# Patient Record
Sex: Female | Born: 1955 | Race: Black or African American | Hispanic: No | Smoking: Current every day smoker
Health system: Southern US, Community
[De-identification: ages and names within clinical notes are randomized; demographics above are authoritative.]

## PROBLEM LIST (undated history)

## (undated) DIAGNOSIS — K219 Gastro-esophageal reflux disease without esophagitis: Secondary | ICD-10-CM

## (undated) DIAGNOSIS — Z9289 Personal history of other medical treatment: Secondary | ICD-10-CM

## (undated) DIAGNOSIS — I5030 Unspecified diastolic (congestive) heart failure: Secondary | ICD-10-CM

## (undated) DIAGNOSIS — I38 Endocarditis, valve unspecified: Secondary | ICD-10-CM

## (undated) DIAGNOSIS — I10 Essential (primary) hypertension: Secondary | ICD-10-CM

## (undated) DIAGNOSIS — I509 Heart failure, unspecified: Secondary | ICD-10-CM

## (undated) DIAGNOSIS — I351 Nonrheumatic aortic (valve) insufficiency: Secondary | ICD-10-CM

## (undated) HISTORY — DX: Personal history of other medical treatment: Z92.89

## (undated) HISTORY — DX: Endocarditis, valve unspecified: I38

## (undated) HISTORY — DX: Unspecified diastolic (congestive) heart failure: I50.30

## (undated) HISTORY — DX: Nonrheumatic aortic (valve) insufficiency: I35.1

## (undated) HISTORY — DX: Heart failure, unspecified: I50.9

## (undated) HISTORY — PX: NO PAST SURGERIES: SHX2092

---

## 2004-06-12 ENCOUNTER — Emergency Department: Payer: Self-pay | Admitting: Emergency Medicine

## 2004-10-12 ENCOUNTER — Emergency Department: Payer: Self-pay | Admitting: Emergency Medicine

## 2007-04-16 ENCOUNTER — Emergency Department: Payer: Self-pay | Admitting: Emergency Medicine

## 2007-08-13 ENCOUNTER — Emergency Department: Payer: Self-pay | Admitting: Emergency Medicine

## 2008-02-04 ENCOUNTER — Ambulatory Visit: Payer: Self-pay | Admitting: General Practice

## 2008-06-04 ENCOUNTER — Ambulatory Visit: Payer: Self-pay | Admitting: Family Medicine

## 2009-06-07 ENCOUNTER — Ambulatory Visit: Payer: Self-pay | Admitting: Family Medicine

## 2010-06-19 ENCOUNTER — Ambulatory Visit: Payer: Self-pay | Admitting: Family Medicine

## 2010-07-11 ENCOUNTER — Ambulatory Visit: Payer: Self-pay | Admitting: General Practice

## 2010-07-14 ENCOUNTER — Ambulatory Visit: Payer: Self-pay | Admitting: Family Medicine

## 2010-09-09 ENCOUNTER — Emergency Department: Payer: Self-pay | Admitting: Internal Medicine

## 2011-07-05 ENCOUNTER — Ambulatory Visit: Payer: Self-pay | Admitting: Family Medicine

## 2011-08-08 ENCOUNTER — Ambulatory Visit: Payer: Self-pay | Admitting: General Surgery

## 2011-08-15 LAB — PATHOLOGY REPORT

## 2012-07-08 ENCOUNTER — Ambulatory Visit: Payer: Self-pay | Admitting: Family Medicine

## 2013-05-26 ENCOUNTER — Emergency Department: Payer: Self-pay | Admitting: Emergency Medicine

## 2013-05-26 LAB — CBC WITH DIFFERENTIAL/PLATELET
BASOS PCT: 0.6 %
Basophil #: 0 10*3/uL (ref 0.0–0.1)
EOS ABS: 0.3 10*3/uL (ref 0.0–0.7)
EOS PCT: 3.5 %
HCT: 44.5 % (ref 35.0–47.0)
HGB: 14.1 g/dL (ref 12.0–16.0)
LYMPHS PCT: 23.3 %
Lymphocyte #: 1.9 10*3/uL (ref 1.0–3.6)
MCH: 28.5 pg (ref 26.0–34.0)
MCHC: 31.7 g/dL — ABNORMAL LOW (ref 32.0–36.0)
MCV: 90 fL (ref 80–100)
Monocyte #: 0.8 x10 3/mm (ref 0.2–0.9)
Monocyte %: 9.8 %
Neutrophil #: 5 10*3/uL (ref 1.4–6.5)
Neutrophil %: 62.8 %
Platelet: 263 10*3/uL (ref 150–440)
RBC: 4.95 10*6/uL (ref 3.80–5.20)
RDW: 15.5 % — AB (ref 11.5–14.5)
WBC: 8 10*3/uL (ref 3.6–11.0)

## 2013-05-26 LAB — COMPREHENSIVE METABOLIC PANEL
AST: 20 U/L (ref 15–37)
Albumin: 3.3 g/dL — ABNORMAL LOW (ref 3.4–5.0)
Alkaline Phosphatase: 103 U/L
Anion Gap: 2 — ABNORMAL LOW (ref 7–16)
BUN: 10 mg/dL (ref 7–18)
Bilirubin,Total: 0.4 mg/dL (ref 0.2–1.0)
CALCIUM: 9.2 mg/dL (ref 8.5–10.1)
Chloride: 106 mmol/L (ref 98–107)
Co2: 28 mmol/L (ref 21–32)
Creatinine: 0.97 mg/dL (ref 0.60–1.30)
EGFR (Non-African Amer.): >60
Glucose: 76 mg/dL (ref 65–99)
OSMOLALITY: 270 (ref 275–301)
POTASSIUM: 3.6 mmol/L (ref 3.5–5.1)
SGPT (ALT): 19 U/L (ref 12–78)
Sodium: 136 mmol/L (ref 136–145)
TOTAL PROTEIN: 8.4 g/dL — AB (ref 6.4–8.2)

## 2013-05-26 LAB — URINALYSIS, COMPLETE
BLOOD: NEGATIVE
Bilirubin,UR: NEGATIVE
GLUCOSE, UR: NEGATIVE mg/dL (ref 0–75)
KETONE: NEGATIVE
NITRITE: NEGATIVE
PH: 6 (ref 4.5–8.0)
PROTEIN: NEGATIVE
RBC,UR: 1 /HPF (ref 0–5)
SPECIFIC GRAVITY: 1.005 (ref 1.003–1.030)
WBC UR: 1 /HPF (ref 0–5)

## 2013-05-26 LAB — LIPASE, BLOOD: LIPASE: 1425 U/L — AB (ref 73–393)

## 2013-05-26 LAB — TROPONIN I

## 2013-07-08 ENCOUNTER — Ambulatory Visit: Payer: Self-pay | Admitting: Family Medicine

## 2013-08-15 ENCOUNTER — Emergency Department: Payer: Self-pay | Admitting: Emergency Medicine

## 2013-09-07 ENCOUNTER — Emergency Department: Payer: Self-pay | Admitting: Emergency Medicine

## 2013-09-07 LAB — COMPREHENSIVE METABOLIC PANEL
ALK PHOS: 88 U/L
ALT: 14 U/L (ref 12–78)
ANION GAP: 6 — AB (ref 7–16)
Albumin: 3 g/dL — ABNORMAL LOW (ref 3.4–5.0)
BUN: 16 mg/dL (ref 7–18)
Bilirubin,Total: 0.3 mg/dL (ref 0.2–1.0)
CALCIUM: 8.7 mg/dL (ref 8.5–10.1)
CREATININE: 1 mg/dL (ref 0.60–1.30)
Chloride: 107 mmol/L (ref 98–107)
Co2: 23 mmol/L (ref 21–32)
Glucose: 137 mg/dL — ABNORMAL HIGH (ref 65–99)
OSMOLALITY: 275 (ref 275–301)
POTASSIUM: 3.2 mmol/L — AB (ref 3.5–5.1)
SGOT(AST): 24 U/L (ref 15–37)
SODIUM: 136 mmol/L (ref 136–145)
TOTAL PROTEIN: 7.7 g/dL (ref 6.4–8.2)

## 2013-09-07 LAB — CBC
HCT: 41 % (ref 35.0–47.0)
HGB: 13.5 g/dL (ref 12.0–16.0)
MCH: 30.3 pg (ref 26.0–34.0)
MCHC: 32.9 g/dL (ref 32.0–36.0)
MCV: 92 fL (ref 80–100)
PLATELETS: 214 10*3/uL (ref 150–440)
RBC: 4.45 10*6/uL (ref 3.80–5.20)
RDW: 15.7 % — ABNORMAL HIGH (ref 11.5–14.5)
WBC: 8.6 10*3/uL (ref 3.6–11.0)

## 2013-09-07 LAB — LIPASE, BLOOD: LIPASE: 81 U/L (ref 73–393)

## 2014-07-14 ENCOUNTER — Ambulatory Visit
Admit: 2014-07-14 | Disposition: A | Discharge status: Home / Self Care | Payer: Self-pay | Attending: Internal Medicine | Admitting: Internal Medicine

## 2015-11-05 ENCOUNTER — Emergency Department: Payer: BLUE CROSS/BLUE SHIELD

## 2015-11-05 ENCOUNTER — Encounter: Payer: Self-pay | Admitting: Emergency Medicine

## 2015-11-05 ENCOUNTER — Emergency Department
Admission: EM | Admit: 2015-11-05 | Discharge: 2015-11-05 | Disposition: A | Discharge status: Home / Self Care | Payer: BLUE CROSS/BLUE SHIELD | Attending: Emergency Medicine | Admitting: Emergency Medicine

## 2015-11-05 DIAGNOSIS — R0602 Shortness of breath: Secondary | ICD-10-CM | POA: Diagnosis present

## 2015-11-05 DIAGNOSIS — J810 Acute pulmonary edema: Secondary | ICD-10-CM | POA: Insufficient documentation

## 2015-11-05 DIAGNOSIS — F17210 Nicotine dependence, cigarettes, uncomplicated: Secondary | ICD-10-CM | POA: Insufficient documentation

## 2015-11-05 HISTORY — DX: Gastro-esophageal reflux disease without esophagitis: K21.9

## 2015-11-05 LAB — CBC WITH DIFFERENTIAL/PLATELET
Basophils Absolute: 0.1 10*3/uL (ref 0–0.1)
Basophils Relative: 1 %
Eosinophils Absolute: 0.4 10*3/uL (ref 0–0.7)
Eosinophils Relative: 6 %
HEMATOCRIT: 37.5 % (ref 35.0–47.0)
Hemoglobin: 12.8 g/dL (ref 12.0–16.0)
LYMPHS ABS: 1.6 10*3/uL (ref 1.0–3.6)
MCH: 30.2 pg (ref 26.0–34.0)
MCHC: 34.2 g/dL (ref 32.0–36.0)
MCV: 88.4 fL (ref 80.0–100.0)
MONO ABS: 0.6 10*3/uL (ref 0.2–0.9)
Monocytes Relative: 8 %
Neutro Abs: 4.7 10*3/uL (ref 1.4–6.5)
Neutrophils Relative %: 64 %
Platelets: 183 10*3/uL (ref 150–440)
RBC: 4.24 MIL/uL (ref 3.80–5.20)
RDW: 15 % — ABNORMAL HIGH (ref 11.5–14.5)
WBC: 7.3 10*3/uL (ref 3.6–11.0)

## 2015-11-05 LAB — COMPREHENSIVE METABOLIC PANEL
ALK PHOS: 81 U/L (ref 38–126)
ALT: 12 U/L — ABNORMAL LOW (ref 14–54)
AST: 16 U/L (ref 15–41)
Albumin: 3.6 g/dL (ref 3.5–5.0)
Anion gap: 6 (ref 5–15)
BILIRUBIN TOTAL: 0.7 mg/dL (ref 0.3–1.2)
BUN: 10 mg/dL (ref 6–20)
CALCIUM: 8.7 mg/dL — AB (ref 8.9–10.3)
CO2: 23 mmol/L (ref 22–32)
Chloride: 111 mmol/L (ref 101–111)
Creatinine, Ser: 0.86 mg/dL (ref 0.44–1.00)
GFR calc Af Amer: >60 mL/min (ref >60)
GFR calc non Af Amer: >60 mL/min (ref >60)
Glucose, Bld: 99 mg/dL (ref 65–99)
Potassium: 3.6 mmol/L (ref 3.5–5.1)
Sodium: 140 mmol/L (ref 135–145)
TOTAL PROTEIN: 7.5 g/dL (ref 6.5–8.1)

## 2015-11-05 LAB — TROPONIN I: Troponin I: <0.03 ng/mL (ref <0.03)

## 2015-11-05 LAB — BRAIN NATRIURETIC PEPTIDE: B NATRIURETIC PEPTIDE 5: 91 pg/mL (ref 0.0–100.0)

## 2015-11-05 MED ORDER — FUROSEMIDE 20 MG PO TABS: 20.0000 mg | ORAL_TABLET | Freq: Every day | ORAL | 0 refills | Status: DC

## 2015-11-05 MED ORDER — ALBUTEROL SULFATE (2.5 MG/3ML) 0.083% IN NEBU: 5.0000 mg | INHALATION_SOLUTION | Freq: Once | RESPIRATORY_TRACT | Status: DC

## 2015-11-05 MED ORDER — FUROSEMIDE 40 MG PO TABS
20.0000 mg | ORAL_TABLET | Freq: Once | ORAL | Status: AC
  Administered 2015-11-05: 20 mg via ORAL
  Filled 2015-11-05: qty 1

## 2015-11-05 NOTE — ED Provider Notes (Signed)
[facility] Emergency Department Provider Note  Time seen: 2:27 PM  I have reviewed the triage vital signs and the nursing notes.   HISTORY  Chief Complaint Respiratory Distress    HPI Sarah Mack is a 60 y.o. female with a past medical history of gastric reflux who presents the emergency department with 1 week of cough and intermittent shortness of breath. According to the patient for the past one week she has been coughing with white/clear sputum production. Denies any fever, chest pain, leg swelling or leg pain. Patient states for the past 2 days she has been feeling short of breath intermittently. States she is able to lie down when going to bed without issue. Denies any recent chest pain, tightness or pressure. Denies any history of cardiac disease, CHF or COPD.Describes her shortness of breath as mild, moderate occasionally.  Past Medical History:  Diagnosis Date  . GERD (gastroesophageal reflux disease)     There are no active problems to display for this patient.   History reviewed. No pertinent surgical history.  Prior to Admission medications   Not on File    No Known Allergies  No family history on file.  Social History Social History  Substance Use Topics  . Smoking status: Current Every Day Smoker    Packs/day: 0.50    Types: Cigarettes  . Smokeless tobacco: Not on file  . Alcohol use Not on file    Review of Systems Constitutional: Negative for fever. Cardiovascular: Negative for chest pain. Respiratory: Positive for shortness of breath 1-2 days. Gastrointestinal: Negative for abdominal pain, vomiting and diarrhea. Genitourinary: Negative for dysuria. Musculoskeletal: Negative for back pain. Skin: Negative for rash. Neurological: Negative for headaches, focal weakness or numbness. 10-point ROS otherwise negative.  ____________________________________________   PHYSICAL EXAM:  VITAL SIGNS: ED Triage Vitals  Enc  Vitals Group     BP 11/05/15 1303 (!) 138/58     Pulse Rate 11/05/15 1303 88     Resp 11/05/15 1303 18     Temp 11/05/15 1303 97.7 F (36.5 C)     Temp Source 11/05/15 1303 Oral     SpO2 11/05/15 1303 95 %     Weight 11/05/15 1303 202 lb (91.6 kg)     Height 11/05/15 1303  (1.6 m)     Head Circumference --      Peak Flow --      Pain Score 11/05/15 1305 3     Pain Loc --      Pain Edu? --      Excl. in GC? --     Constitutional: Alert and oriented. Well appearing and in no distress. Eyes: Normal exam ENT   Head: Normocephalic and atraumatic.   Mouth/Throat: Mucous membranes are moist. Cardiovascular: Normal rate, regular rhythm. No murmur Respiratory: Normal respiratory effort without tachypnea nor retractions. Breath sounds are clear  Gastrointestinal: Soft and nontender. No distention.   Musculoskeletal: Nontender with normal range of motion in all extremities. No lower extremity tenderness or edema. Neurologic:  Normal speech and language. No gross focal neurologic deficits are appreciated. Skin:  Skin is warm, dry and intact.  Psychiatric: Mood and affect are normal. Speech and behavior are normal.   ____________________________________________    EKG  EKG reviewed and interpreted by myself shows normal sinus rhythm at 77 bpm, narrow QRS, normal axis, normal intervals, no concerning ST changes noted.  ____________________________________________    RADIOLOGY  Chest x-ray shows perihilar edema most consistent with  mild pulmonary edema.  ____________________________________________   INITIAL IMPRESSION / ASSESSMENT AND PLAN / ED COURSE  Pertinent labs & imaging results that were available during my care of the patient were reviewed by me and considered in my medical decision making (see chart for details).  Patient presents with 1 week of cough with white/clear sputum, 1-2 days of shortness of breath. Overall the patient appears well, denies any  shortness of breath at rest in the bed. Satting 94% on room air. Denies any recent chest pain. Patient's labs are largely within normal limits including negative troponin. EKG is unremarkable. Chest x-ray most consistent with mild pulmonary edema. I discussed this with the patient, she has no history of MI or CHF in the past. I discussed with Dr. Excell Seltzerooper of cardiology, who has reviewed the patient's labs and x-ray as well. We will start the patient on Lasix 40 mg daily for the next 3 days, and then 20 mg daily until she can be seen this week for her echocardiogram. I discussed this with the patient is agreeable to the plan, as well as strict return precautions for any increased shortness of breath or chest pain. We will discharge the patient home  ____________________________________________   FINAL CLINICAL IMPRESSION(S) / ED DIAGNOSES  Pulmonary edema Dyspnea    Minna AntisKevin Shiloh Southern, MD 11/05/15 1434

## 2015-11-05 NOTE — ED Notes (Signed)
Patient c/o shortness of breath and cough for the last 2 days. Patient states that she has had a chest cold this past week and has been coughing so much it has made her sore. Patient states that cough is productive with white sputum. Patient currently in NAD, respirations equal and unlabored.

## 2015-11-05 NOTE — ED Triage Notes (Signed)
States has had cough and SOB with exertion x 1 week. Denies fevers.

## 2015-11-07 ENCOUNTER — Other Ambulatory Visit: Payer: Self-pay

## 2015-11-07 DIAGNOSIS — I509 Heart failure, unspecified: Secondary | ICD-10-CM

## 2015-11-08 ENCOUNTER — Ambulatory Visit (INDEPENDENT_AMBULATORY_CARE_PROVIDER_SITE_OTHER): Payer: BLUE CROSS/BLUE SHIELD

## 2015-11-08 ENCOUNTER — Other Ambulatory Visit: Payer: Self-pay

## 2015-11-08 DIAGNOSIS — I509 Heart failure, unspecified: Secondary | ICD-10-CM

## 2015-11-17 ENCOUNTER — Ambulatory Visit: Payer: BLUE CROSS/BLUE SHIELD | Attending: Family | Admitting: Family

## 2015-11-17 ENCOUNTER — Encounter: Payer: Self-pay | Admitting: Family

## 2015-11-17 DIAGNOSIS — Z720 Tobacco use: Secondary | ICD-10-CM | POA: Diagnosis not present

## 2015-11-17 DIAGNOSIS — R000 Tachycardia, unspecified: Secondary | ICD-10-CM | POA: Insufficient documentation

## 2015-11-17 DIAGNOSIS — Z8249 Family history of ischemic heart disease and other diseases of the circulatory system: Secondary | ICD-10-CM | POA: Insufficient documentation

## 2015-11-17 DIAGNOSIS — I351 Nonrheumatic aortic (valve) insufficiency: Secondary | ICD-10-CM | POA: Insufficient documentation

## 2015-11-17 DIAGNOSIS — Z800 Family history of malignant neoplasm of digestive organs: Secondary | ICD-10-CM | POA: Insufficient documentation

## 2015-11-17 DIAGNOSIS — K219 Gastro-esophageal reflux disease without esophagitis: Secondary | ICD-10-CM | POA: Insufficient documentation

## 2015-11-17 DIAGNOSIS — F17210 Nicotine dependence, cigarettes, uncomplicated: Secondary | ICD-10-CM | POA: Diagnosis not present

## 2015-11-17 DIAGNOSIS — I5032 Chronic diastolic (congestive) heart failure: Secondary | ICD-10-CM | POA: Insufficient documentation

## 2015-11-17 DIAGNOSIS — Z833 Family history of diabetes mellitus: Secondary | ICD-10-CM | POA: Insufficient documentation

## 2015-11-17 MED ORDER — FUROSEMIDE 20 MG PO TABS: 20.0000 mg | ORAL_TABLET | Freq: Every day | ORAL | 3 refills | Status: DC

## 2015-11-17 NOTE — Progress Notes (Signed)
Subjective:    Patient ID: Sarah Mack, female    DOB: 28-Mar-1955, 60 y.o.   MRN: [id number]  Congestive Heart Failure  Presents for initial visit. The disease course has been improving. Associated symptoms include fatigue (mild). Pertinent negatives include no abdominal pain, chest pain, edema, orthopnea, palpitations or shortness of breath. The symptoms have been improving. Past treatments include ACE inhibitors and salt and fluid restriction. The treatment provided significant relief. Compliance with prior treatments has been good. There is no history of CVA, DM or HTN. She has one 1st degree relative with heart disease.  Nicotine Dependence  Presents for initial visit. Symptoms include fatigue (mild). Symptoms are negative for sore throat. Preferred tobacco types include cigarettes. Preferred cigarette types include filtered. Preferred strength is light. Her urge triggers include company of smokers, meal time and stress. She smokes < 1/2 a pack of cigarettes per day. Past treatments include nothing. Moon is thinking about quitting. There is no history of alcohol abuse.   Past Medical History:  Diagnosis Date  . CHF (congestive heart failure) (HCC)   . GERD (gastroesophageal reflux disease)     Past Surgical History:  Procedure Laterality Date  . NO PAST SURGERIES      Family History  Problem Relation Age of Onset  . Liver cancer Mother   . Heart failure Father   . Diabetes Father     Social History  Substance Use Topics  . Smoking status: Current Every Day Smoker    Packs/day: 0.50    Types: Cigarettes  . Smokeless tobacco: Never Used  . Alcohol use No    No Known Allergies  Prior to Admission medications   Medication Sig Start Date End Date Taking? Authorizing Provider  cetirizine (ZYRTEC) 10 MG tablet Take 10 mg by mouth daily as needed for allergies.   Yes Historical Provider, MD  furosemide (LASIX) 20 MG tablet Take 1 tablet (20 mg total) by mouth daily.  11/17/15  Yes Delma Freeze, FNP  ramipril (ALTACE) 5 MG capsule Take 5 mg by mouth daily.   Yes Historical Provider, MD      Review of Systems  Constitutional: Positive for fatigue (mild). Negative for appetite change.  HENT: Negative for congestion, postnasal drip and sore throat.   Eyes: Negative.   Respiratory: Positive for cough (more at night). Negative for chest tightness and shortness of breath.   Cardiovascular: Negative for chest pain, palpitations and leg swelling.  Gastrointestinal: Negative for abdominal distention and abdominal pain.  Endocrine: Negative.   Genitourinary: Negative.   Musculoskeletal: Negative for back pain and neck pain.  Skin: Negative.   Allergic/Immunologic: Negative.   Neurological: Negative for dizziness and light-headedness.  Hematological: Negative for adenopathy. Does not bruise/bleed easily.  Psychiatric/Behavioral: Positive for sleep disturbance (working different shifts). Negative for dysphoric mood. The patient is not nervous/anxious.        Objective:   Physical Exam  Constitutional: She is oriented to person, place, and time. She appears well-developed and well-nourished.  HENT:  Head: Normocephalic and atraumatic.  Eyes: Conjunctivae are normal. Pupils are equal, round, and reactive to light.  Neck: Normal range of motion. Neck supple.  Cardiovascular: Regular rhythm.  Tachycardia present.   Pulmonary/Chest: Effort normal. She has no wheezes. She has no rales.  Abdominal: Soft. She exhibits no distension. There is no tenderness.  Musculoskeletal: She exhibits no edema or tenderness.  Neurological: She is alert and oriented to person, place, and time.  Skin: Skin is  warm and dry.  Psychiatric: She has a normal mood and affect. Her behavior is normal. Thought content normal.  Nursing note and vitals reviewed.   BP (!) 103/55   Pulse 90   Resp 18   Ht 5' (1.524 m)   Wt 209 lb (94.8 kg)   SpO2 96%   BMI 40.82 kg/m         Assessment & Plan:  1: Chronic heart failure with preserved ejection fraction- Patient presents with a mild amount of fatigue with moderate exertion (Class II). She denies any shortness of breath or swelling in her legs/abdomen. She isn't weighing herself daily because she doesn't have any scales so a set of scales was given to her today. Instructed her to weigh herself first thing in the morning after using the bathroom and to call for an overnight weight gain of >2 pounds or a weekly weight gain of >5 pounds. She does add salt to her food. Discussed at length the importance of not adding any salt to her food and reviewed how to read food labels so that she can stay under 2000mg  sodium daily. Written dietary information was also given to her. She will need to get set up with a cardiologist. 2: Tobacco use- She says that she's smoking 1/2 ppd of cigarettes and is working on trying to quit. Complete cessation was discussed for 3 minutes with her.  3: Tachycardia- Heart rate in the 90's today. May need a beta blocker if her heart rate remains elevated.  Medication bottles were reviewed.  Patient mentions that she would like a female provider to become her PCP. Telephone numbers were given to 3 local practices that have female providers for patient to call and see if they are accepting new patients.   Return here in 1 month or sooner for any questions/problems before then.

## 2015-11-17 NOTE — Patient Instructions (Addendum)
Begin weighing daily and call for an overnight weight gain of > 2 pounds or a weekly weight gain of >5 pounds.   Call [facility] at [phone] to see if their female provider is taking new patients.  [facility] [place] [facility] at [phone]. Call [facility] at [phone]

## 2015-12-16 ENCOUNTER — Encounter: Payer: Self-pay | Admitting: Family

## 2015-12-16 ENCOUNTER — Ambulatory Visit: Payer: BLUE CROSS/BLUE SHIELD | Attending: Family | Admitting: Family

## 2015-12-16 VITALS — BP 92/49 | HR 84 | Resp 18 | Ht 59.0 in | Wt 202.0 lb

## 2015-12-16 DIAGNOSIS — I5032 Chronic diastolic (congestive) heart failure: Secondary | ICD-10-CM | POA: Insufficient documentation

## 2015-12-16 DIAGNOSIS — Z800 Family history of malignant neoplasm of digestive organs: Secondary | ICD-10-CM | POA: Insufficient documentation

## 2015-12-16 DIAGNOSIS — Z8249 Family history of ischemic heart disease and other diseases of the circulatory system: Secondary | ICD-10-CM | POA: Insufficient documentation

## 2015-12-16 DIAGNOSIS — K219 Gastro-esophageal reflux disease without esophagitis: Secondary | ICD-10-CM | POA: Diagnosis not present

## 2015-12-16 DIAGNOSIS — R000 Tachycardia, unspecified: Secondary | ICD-10-CM | POA: Insufficient documentation

## 2015-12-16 DIAGNOSIS — F17210 Nicotine dependence, cigarettes, uncomplicated: Secondary | ICD-10-CM | POA: Insufficient documentation

## 2015-12-16 DIAGNOSIS — Z720 Tobacco use: Secondary | ICD-10-CM

## 2015-12-16 NOTE — Progress Notes (Signed)
Patient ID: Sarah Mack, female    DOB: [birth year], 60 y.o.   MRN: [id number]  HPI  Ms. Sarah Mack is a 60 y/o female with a history of GERD, aortic insufficiency, chronic diastolic HF with ongoing tobacco use who returns for a follow-up appointment.   Last echo was done 11/08/15 with an EF of 55-60% without any aortic stenosis. Mild-mod aortic regurgitation  Last visit to the ED was 11/05/15 with acute pulmonary edema. Still hasn't gotten established with a PCP or cardiologist. [place] [employer].  Returns today for follow-up. Weight is down as she says that she's walking more and eating less. Drinking water and no more sodas. Denies any shortness of breath or swelling in her legs/abdomen. Only gets tired after working all day. Continues to smoke 1/2 ppd of cigarettes.   Past Medical History:  Diagnosis Date  . CHF (congestive heart failure) (HCC)   . GERD (gastroesophageal reflux disease)     Past Surgical History:  Procedure Laterality Date  . NO PAST SURGERIES      Family History  Problem Relation Age of Onset  . Liver cancer Mother   . Heart failure Father   . Diabetes Father     Social History  Substance Use Topics  . Smoking status: Current Every Day Smoker    Packs/day: 0.50    Types: Cigarettes  . Smokeless tobacco: Never Used  . Alcohol use No    No Known Allergies  Prior to Admission medications   Medication Sig Start Date End Date Taking? Authorizing Provider  cetirizine (ZYRTEC) 10 MG tablet Take 10 mg by mouth daily as needed for allergies.   Yes Historical Provider, MD  furosemide (LASIX) 20 MG tablet Take 1 tablet (20 mg total) by mouth daily. 11/17/15  Yes Delma Freezeina A Laruen Risser, FNP  ramipril (ALTACE) 5 MG capsule Take 5 mg by mouth daily.   Yes Historical Provider, MD    Review of Systems  Constitutional: Positive for fatigue (after working for 8 hours and standing the whole time). Negative for appetite change.  HENT: Negative for  congestion, postnasal drip and sore throat.   Eyes: Negative.   Respiratory: Negative for cough, chest tightness and shortness of breath.   Cardiovascular: Negative for chest pain, palpitations and leg swelling.  Gastrointestinal: Negative.   Endocrine: Negative.   Genitourinary: Negative.   Musculoskeletal: Negative for back pain and neck pain.  Skin: Negative.   Allergic/Immunologic: Negative.   Neurological: Negative for dizziness and light-headedness.  Hematological: Negative for adenopathy. Does not bruise/bleed easily.  Psychiatric/Behavioral: Negative for dysphoric mood and sleep disturbance. The patient is not nervous/anxious.    Vitals:   12/16/15 1330  BP: (!) 92/49  Pulse: 84  Resp: 18  SpO2: 96%  Weight: 202 lb (91.6 kg)  Height: 4\' 11"  (1.499 m)      Physical Exam  Constitutional: She is oriented to person, place, and time. She appears well-developed and well-nourished.  HENT:  Head: Normocephalic and atraumatic.  Eyes: Conjunctivae are normal. Pupils are equal, round, and reactive to light.  Neck: Normal range of motion. Neck supple.  Cardiovascular: Normal rate and regular rhythm.   Pulmonary/Chest: Effort normal. She has no wheezes. She has no rales.  Abdominal: Soft. She exhibits no distension. There is no tenderness.  Musculoskeletal: She exhibits no edema or tenderness.  Neurological: She is alert and oriented to person, place, and time.  Skin: Skin is warm and dry.  Psychiatric: She  has a normal mood and affect. Her behavior is normal. Thought content normal.  Vitals reviewed.    Assessment & Plan:  1: Chronic heart failure with preserved ejection fraction- -Echo done 11/08/15 with an EF of 55-60%.  -NYHA Class II -Volume status stable -Congratulated on weight loss. Down 7.6 pounds since she was last here on 11/17/15. Continue exercise and call for an overnight weight gain of >2 pounds or a weekly weight gain of >5 pounds.  -Needs cardiologist  appointment. Offered to call while in the office but she says that she'll call herself.   2: Tachycardia- -Resolved  3: Tobacco use- -Continues to smoke 1/2 ppd of cigarettes. Complete cessation discussed for 3 minutes with her.  -Needs PCP appointment. Again, patient says that she will call herself. 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   Return in 3 months or sooner for any questions/problems before then.

## 2015-12-16 NOTE — Patient Instructions (Signed)
Continue weighing daily and call for an overnight weight gain of > 2 pounds or a weekly weight gain of >5 pounds.    Smoking Cessation Quitting smoking is important to your health and has many advantages. However, it is not always easy to quit since nicotine is a very addictive drug. Oftentimes, people try 3 times or more before being able to quit. This document explains the best ways for you to prepare to quit smoking. Quitting takes hard work and a lot of effort, but you can do it. ADVANTAGES OF QUITTING SMOKING  You will live longer, feel better, and live better.  Your body will feel the impact of quitting smoking almost immediately.  Within 20 minutes, blood pressure decreases. Your pulse returns to its normal level.  After 8 hours, carbon monoxide levels in the blood return to normal. Your oxygen level increases.  After 24 hours, the chance of having a heart attack starts to decrease. Your breath, hair, and body stop smelling like smoke.  After 48 hours, damaged nerve endings begin to recover. Your sense of taste and smell improve.  After 72 hours, the body is virtually free of nicotine. Your bronchial tubes relax and breathing becomes easier.  After 2 to 12 weeks, lungs can hold more air. Exercise becomes easier and circulation improves.  The risk of having a heart attack, stroke, cancer, or lung disease is greatly reduced.  After 1 year, the risk of coronary heart disease is cut in half.  After 5 years, the risk of stroke falls to the same as a nonsmoker.  After 10 years, the risk of lung cancer is cut in half and the risk of other cancers decreases significantly.  After 15 years, the risk of coronary heart disease drops, usually to the level of a nonsmoker.  If you are pregnant, quitting smoking will improve your chances of having a healthy baby.  The people you live with, especially any children, will be healthier.  You will have extra money to spend on things other  than cigarettes. QUESTIONS TO THINK ABOUT BEFORE ATTEMPTING TO QUIT You may want to talk about your answers with your health care provider.  Why do you want to quit?  If you tried to quit in the past, what helped and what did not?  What will be the most difficult situations for you after you quit? How will you plan to handle them?  Who can help you through the tough times? Your family? Friends? A health care provider?  What pleasures do you get from smoking? What ways can you still get pleasure if you quit? Here are some questions to ask your health care provider:  How can you help me to be successful at quitting?  What medicine do you think would be best for me and how should I take it?  What should I do if I need more help?  What is smoking withdrawal like? How can I get information on withdrawal? GET READY  Set a quit date.  Change your environment by getting rid of all cigarettes, ashtrays, matches, and lighters in your home, car, or work. Do not let people smoke in your home.  Review your past attempts to quit. Think about what worked and what did not. GET SUPPORT AND ENCOURAGEMENT You have a better chance of being successful if you have help. You can get support in many ways.  Tell your family, friends, and coworkers that you are going to quit and need their support. Ask   them not to smoke around you.  Get individual, group, or telephone counseling and support. Programs are available at local hospitals and health centers. Call your local health department for information about programs in your area.  Spiritual beliefs and practices may help some smokers quit.  Download a "quit meter" on your computer to keep track of quit statistics, such as how long you have gone without smoking, cigarettes not smoked, and money saved.  Get a self-help book about quitting smoking and staying off tobacco. LEARN NEW SKILLS AND BEHAVIORS  Distract yourself from urges to smoke. Talk to  someone, go for a walk, or occupy your time with a task.  Change your normal routine. Take a different route to work. Drink tea instead of coffee. Eat breakfast in a different place.  Reduce your stress. Take a hot bath, exercise, or read a book.  Plan something enjoyable to do every day. Reward yourself for not smoking.  Explore interactive web-based programs that specialize in helping you quit. GET MEDICINE AND USE IT CORRECTLY Medicines can help you stop smoking and decrease the urge to smoke. Combining medicine with the above behavioral methods and support can greatly increase your chances of successfully quitting smoking.  Nicotine replacement therapy helps deliver nicotine to your body without the negative effects and risks of smoking. Nicotine replacement therapy includes nicotine gum, lozenges, inhalers, nasal sprays, and skin patches. Some may be available over-the-counter and others require a prescription.  Antidepressant medicine helps people abstain from smoking, but how this works is unknown. This medicine is available by prescription.  Nicotinic receptor partial agonist medicine simulates the effect of nicotine in your brain. This medicine is available by prescription. Ask your health care provider for advice about which medicines to use and how to use them based on your health history. Your health care provider will tell you what side effects to look out for if you choose to be on a medicine or therapy. Carefully read the information on the package. Do not use any other product containing nicotine while using a nicotine replacement product.  RELAPSE OR DIFFICULT SITUATIONS Most relapses occur within the first 3 months after quitting. Do not be discouraged if you start smoking again. Remember, most people try several times before finally quitting. You may have symptoms of withdrawal because your body is used to nicotine. You may crave cigarettes, be irritable, feel very hungry, cough  often, get headaches, or have difficulty concentrating. The withdrawal symptoms are only temporary. They are strongest when you first quit, but they will go away within 10-14 days. To reduce the chances of relapse, try to:  Avoid drinking alcohol. Drinking lowers your chances of successfully quitting.  Reduce the amount of caffeine you consume. Once you quit smoking, the amount of caffeine in your body increases and can give you symptoms, such as a rapid heartbeat, sweating, and anxiety.  Avoid smokers because they can make you want to smoke.  Do not let weight gain distract you. Many smokers will gain weight when they quit, usually less than 10 pounds. Eat a healthy diet and stay active. You can always lose the weight gained after you quit.  Find ways to improve your mood other than smoking. FOR MORE INFORMATION  www.smokefree.gov  Document Released: 03/06/2001 Document Revised: 07/27/2013 Document Reviewed: 06/21/2011 [employer] Patient Information 2015 [employer]. This information is not intended to replace advice given to you by your health care provider. Make sure you discuss any questions you have with your   health care provider.  

## 2015-12-19 DIAGNOSIS — I959 Hypotension, unspecified: Secondary | ICD-10-CM | POA: Insufficient documentation

## 2016-03-02 ENCOUNTER — Encounter: Payer: Self-pay | Admitting: Family

## 2016-03-02 ENCOUNTER — Ambulatory Visit: Payer: BLUE CROSS/BLUE SHIELD | Attending: Family | Admitting: Family

## 2016-03-02 VITALS — BP 97/44 | HR 91 | Resp 18 | Ht 59.0 in | Wt 197.0 lb

## 2016-03-02 DIAGNOSIS — K219 Gastro-esophageal reflux disease without esophagitis: Secondary | ICD-10-CM | POA: Diagnosis not present

## 2016-03-02 DIAGNOSIS — I959 Hypotension, unspecified: Secondary | ICD-10-CM | POA: Insufficient documentation

## 2016-03-02 DIAGNOSIS — I5032 Chronic diastolic (congestive) heart failure: Secondary | ICD-10-CM | POA: Insufficient documentation

## 2016-03-02 DIAGNOSIS — I351 Nonrheumatic aortic (valve) insufficiency: Secondary | ICD-10-CM | POA: Insufficient documentation

## 2016-03-02 DIAGNOSIS — Z833 Family history of diabetes mellitus: Secondary | ICD-10-CM | POA: Insufficient documentation

## 2016-03-02 DIAGNOSIS — F17210 Nicotine dependence, cigarettes, uncomplicated: Secondary | ICD-10-CM | POA: Insufficient documentation

## 2016-03-02 DIAGNOSIS — Z808 Family history of malignant neoplasm of other organs or systems: Secondary | ICD-10-CM | POA: Diagnosis not present

## 2016-03-02 DIAGNOSIS — Z720 Tobacco use: Secondary | ICD-10-CM

## 2016-03-02 DIAGNOSIS — I950 Idiopathic hypotension: Secondary | ICD-10-CM

## 2016-03-02 DIAGNOSIS — Z8249 Family history of ischemic heart disease and other diseases of the circulatory system: Secondary | ICD-10-CM | POA: Insufficient documentation

## 2016-03-02 NOTE — Progress Notes (Signed)
Patient ID: Sarah Mack, female    DOB: [birth year], 60 y.o.   MRN: [id number]  HPI  Sarah Mack is a 60 y/o female with a history of GERD, aortic insufficiency, chronic diastolic HF with ongoing tobacco use who returns for a follow-up appointment.   Last echo was done 11/08/15 with an EF of 55-60% without any aortic stenosis. Mild-mod aortic regurgitation  Last visit to the ED was 11/05/15 with acute pulmonary edema. Still hasn't gotten established with a PCP or cardiologist. [place] [facility].  Returns today for follow-up. Weight is down as she says that she's walking more and eating less. Drinking water and no more sodas. Denies any shortness of breath or swelling in her legs/abdomen. Only gets tired after working all day. Continues to smoke 1/2 ppd of cigarettes.   Past Medical History:  Diagnosis Date  . CHF (congestive heart failure) (HCC)   . GERD (gastroesophageal reflux disease)     Past Surgical History:  Procedure Laterality Date  . NO PAST SURGERIES      Family History  Problem Relation Age of Onset  . Liver cancer Mother   . Heart failure Father   . Diabetes Father     Social History  Substance Use Topics  . Smoking status: Current Every Day Smoker    Packs/day: 0.50    Types: Cigarettes  . Smokeless tobacco: Never Used  . Alcohol use No    No Known Allergies  Prior to Admission medications   Medication Sig Start Date End Date Taking? Authorizing Provider  cetirizine (ZYRTEC) 10 MG tablet Take 10 mg by mouth daily as needed for allergies.   Yes Historical Provider, MD  furosemide (LASIX) 20 MG tablet Take 1 tablet (20 mg total) by mouth daily. 11/17/15  Yes Delma Freezeina A Suliman Termini, FNP  ramipril (ALTACE) 5 MG capsule Take 5 mg by mouth daily.   Yes Historical Provider, MD     Review of Systems  Constitutional: Positive for fatigue. Negative for appetite change.  HENT: Negative for congestion, postnasal drip and sore throat.   Eyes:  Negative.   Respiratory: Positive for cough. Negative for chest tightness and shortness of breath.   Cardiovascular: Negative for chest pain, palpitations and leg swelling.  Gastrointestinal: Negative for abdominal distention and abdominal pain.  Endocrine: Negative.   Genitourinary: Negative.   Musculoskeletal: Negative for back pain and neck pain.  Skin: Negative.   Allergic/Immunologic: Negative.   Neurological: Negative for dizziness and light-headedness.  Hematological: Negative for adenopathy. Does not bruise/bleed easily.  Psychiatric/Behavioral: Negative for dysphoric mood and sleep disturbance (sleeping on 1 pillow). The patient is not nervous/anxious.    Vitals:   03/02/16 1336  BP: (!) 97/44  Pulse: 91  Resp: 18  SpO2: 97%  Weight: 197 lb (89.4 kg)  Height: 4\' 11"  (1.499 m)   Wt Readings from Last 3 Encounters:  03/02/16 197 lb (89.4 kg)  12/16/15 202 lb (91.6 kg)  11/17/15 209 lb (94.8 kg)   Lab Results  Component Value Date   CREATININE 0.86 11/05/2015   CREATININE 1.00 09/07/2013   CREATININE 0.97 05/26/2013   Physical Exam  Constitutional: She is oriented to person, place, and time. She appears well-developed and well-nourished.  HENT:  Head: Normocephalic and atraumatic.  Eyes: Conjunctivae are normal. Pupils are equal, round, and reactive to light.  Neck: Normal range of motion. Neck supple. No JVD present.  Cardiovascular: Normal rate and regular rhythm.   Pulmonary/Chest:  Effort normal. She has no wheezes. She has no rales.  Abdominal: Soft. She exhibits no distension. There is no tenderness.  Musculoskeletal: She exhibits no edema or tenderness.  Neurological: She is alert and oriented to person, place, and time.  Skin: Skin is warm and dry.  Psychiatric: She has a normal mood and affect. Her behavior is normal. Thought content normal.  Vitals reviewed.   Assessment & Plan:  1: Chronic heart failure with preserved ejection fraction- -Echo done  11/08/15 with an EF of 55-60%.  -NYHA Class II -Volume status stable -Congratulated on weight loss. Down another 5 pounds since she was last here on 11/26/15. Continue exercise and call for an overnight weight gain of >2 pounds or a weekly weight gain of >5 pounds.  -Needs cardiologist appointment. Offered to call while in the office but she says that she'll call herself.  - needs PCP appointment scheduled as well. She says that she will call herself.   2: Hypotension- - BP remains low - advised her to only take her furosemide as needed for weight parameters per above or edema   3: Tobacco use- -Continues to smoke 1/2 ppd of cigarettes. Complete cessation discussed for 3 minutes with her.  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   Return here in 1 month or sooner for any questions/problems before then.

## 2016-03-02 NOTE — Patient Instructions (Addendum)
Continue weighing daily and call for an overnight weight gain of > 2 pounds or a weekly weight gain of >5 pounds.  Stop fluid pill and take it as needed for weight gain above or swelling.

## 2016-04-20 ENCOUNTER — Encounter: Payer: Self-pay | Admitting: Family

## 2016-04-20 ENCOUNTER — Ambulatory Visit: Payer: BLUE CROSS/BLUE SHIELD | Attending: Family | Admitting: Family

## 2016-04-20 VITALS — BP 114/66 | HR 73 | Resp 18 | Ht 59.02 in | Wt 203.4 lb

## 2016-04-20 DIAGNOSIS — Z720 Tobacco use: Secondary | ICD-10-CM

## 2016-04-20 DIAGNOSIS — I5032 Chronic diastolic (congestive) heart failure: Secondary | ICD-10-CM | POA: Diagnosis not present

## 2016-04-20 DIAGNOSIS — F17210 Nicotine dependence, cigarettes, uncomplicated: Secondary | ICD-10-CM | POA: Insufficient documentation

## 2016-04-20 DIAGNOSIS — Z5189 Encounter for other specified aftercare: Secondary | ICD-10-CM | POA: Insufficient documentation

## 2016-04-20 DIAGNOSIS — I950 Idiopathic hypotension: Secondary | ICD-10-CM

## 2016-04-20 DIAGNOSIS — I959 Hypotension, unspecified: Secondary | ICD-10-CM | POA: Insufficient documentation

## 2016-04-20 NOTE — Patient Instructions (Signed)
Continue weighing daily and call for an overnight weight gain of > 2 pounds or a weekly weight gain of >5 pounds. 

## 2016-04-20 NOTE — Progress Notes (Signed)
Subjective:    Patient ID: Sarah Mack, female    DOB: 03-01-1956, 61 y.o.   MRN: [id number]  HPI  Ms. Desch is a 61 y/o female with a history of GERD, aortic insufficiency, chronic diastolic HF with ongoing tobacco use who returns for a follow-up appointment.   Last echo was done 11/08/15 with an EF of 55-60% without any aortic stenosis. Mild-mod aortic regurgitation  Last visit to the ED was 11/05/15 with acute pulmonary edema. Still hasn't gotten established with a PCP or cardiologist. Being followed by the nurse at the [employer].  Returns today for follow-up. Weight is up from last visit as she says that she's not been walking as much since it's been cold outside. Drinking water and no more sodas. Denies any shortness of breath or swelling in her legs/abdomen. Only gets tired after working all day. Continues to smoke 1/2 ppd of cigarettes.  Past Medical History:  Diagnosis Date  . CHF (congestive heart failure) (HCC)   . GERD (gastroesophageal reflux disease)    Past Surgical History:  Procedure Laterality Date  . NO PAST SURGERIES     Family History  Problem Relation Age of Onset  . Liver cancer Mother   . Heart failure Father   . Diabetes Father    Social History  Substance Use Topics  . Smoking status: Current Every Day Smoker    Packs/day: 0.50    Types: Cigarettes  . Smokeless tobacco: Never Used  . Alcohol use No   No Known Allergies  Prior to Admission medications   Medication Sig Start Date End Date Taking? Authorizing Provider  cetirizine (ZYRTEC) 10 MG tablet Take 10 mg by mouth daily as needed for allergies.   Yes Historical Provider, MD  ramipril (ALTACE) 5 MG capsule Take 5 mg by mouth daily.   Yes Historical Provider, MD     Review of Systems  Constitutional: Negative for appetite change and fatigue.  HENT: Negative for congestion, rhinorrhea and sore throat.   Eyes: Negative.   Respiratory: Positive for cough (dry cough for the last week). Negative  for chest tightness and shortness of breath.   Cardiovascular: Negative for chest pain, palpitations and leg swelling.  Gastrointestinal: Negative for abdominal distention and abdominal pain.  Endocrine: Negative.   Genitourinary: Negative.   Musculoskeletal: Negative for back pain and neck pain.  Skin: Negative.   Allergic/Immunologic: Negative.   Neurological: Negative for dizziness and light-headedness.  Hematological: Negative for adenopathy. Does not bruise/bleed easily.  Psychiatric/Behavioral: Negative for dysphoric mood, sleep disturbance (sleeping on 2 pillows) and suicidal ideas. The patient is not nervous/anxious.    Vitals:   04/20/16 1339  BP: 114/66  Pulse: 73  Resp: 18  SpO2: 99%  Weight: 203 lb 6 oz (92.3 kg)  Height: 4' 11.02" (1.499 m)   Wt Readings from Last 3 Encounters:  04/20/16 203 lb 6 oz (92.3 kg)  03/02/16 197 lb (89.4 kg)  12/16/15 202 lb (91.6 kg)   Lab Results  Component Value Date   CREATININE 0.86 11/05/2015   CREATININE 1.00 09/07/2013   CREATININE 0.97 05/26/2013      Objective:   Physical Exam  Constitutional: She is oriented to person, place, and time. She appears well-developed and well-nourished.  HENT:  Head: Normocephalic and atraumatic.  Eyes: Conjunctivae are normal. Pupils are equal, round, and reactive to light.  Neck: Normal range of motion. Neck supple. No JVD present.  Cardiovascular: Normal rate and regular rhythm.   Pulmonary/Chest:  Effort normal. She has no wheezes. She has no rales.  Abdominal: Soft. She exhibits no distension. There is no tenderness.  Musculoskeletal: She exhibits no edema or tenderness.  Neurological: She is alert and oriented to person, place, and time.  Skin: Skin is warm and dry.  Psychiatric: She has a normal mood and affect. Her behavior is normal. Thought content normal.  Nursing note and vitals reviewed.     Assessment & Plan:  1: Chronic heart failure with preserved ejection  fraction- -Echo done 11/08/15 with an EF of 55-60%.  -NYHA Class I -euvolemic -weight up 6 pounds from her last visit here. Encouraged her to resume her daily walking that she was doing. Has a membership to a gym through her employer and she was encouraged to start going to that. Reminded to call for an overnight weight gain of >2 pounds or a weekly weight gain of >5 pounds -she hasn't made a cardiologist appointment and asks us to schedule it.  -she hasn't made a PCP appointment either and asks us to schedule this as well. Will call patient with those appointments.   2: Hypotension- - BP looks good today - hasn't had to take her furosemide but just a couple of times since she was last here  3: Tobacco use- -Continues to smoke 1/2 ppd of cigarettes. Complete cessation discussed for 3 minutes with her.  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   Patient did not bring her medications nor a list. Each medication was verbally reviewed with the patient and she was encouraged to bring the bottles to every visit to confirm accuracy of list.  Return in 6 months or sooner for any questions/problems before then.

## 2016-05-16 ENCOUNTER — Ambulatory Visit
Admission: RE | Admit: 2016-05-16 | Discharge: 2016-05-16 | Disposition: A | Discharge status: Home / Self Care | Payer: BLUE CROSS/BLUE SHIELD | Source: Ambulatory Visit | Attending: Cardiology | Admitting: Cardiology

## 2016-05-16 ENCOUNTER — Telehealth: Payer: Self-pay | Admitting: *Deleted

## 2016-05-16 ENCOUNTER — Ambulatory Visit (INDEPENDENT_AMBULATORY_CARE_PROVIDER_SITE_OTHER): Payer: BLUE CROSS/BLUE SHIELD | Admitting: Cardiology

## 2016-05-16 ENCOUNTER — Encounter: Payer: Self-pay | Admitting: Cardiology

## 2016-05-16 VITALS — BP 118/62 | HR 68 | Ht 59.0 in | Wt 201.0 lb

## 2016-05-16 DIAGNOSIS — R0602 Shortness of breath: Secondary | ICD-10-CM | POA: Insufficient documentation

## 2016-05-16 DIAGNOSIS — I5032 Chronic diastolic (congestive) heart failure: Secondary | ICD-10-CM

## 2016-05-16 DIAGNOSIS — R059 Cough, unspecified: Secondary | ICD-10-CM

## 2016-05-16 DIAGNOSIS — R05 Cough: Secondary | ICD-10-CM

## 2016-05-16 DIAGNOSIS — I517 Cardiomegaly: Secondary | ICD-10-CM | POA: Diagnosis not present

## 2016-05-16 DIAGNOSIS — R918 Other nonspecific abnormal finding of lung field: Secondary | ICD-10-CM | POA: Diagnosis not present

## 2016-05-16 NOTE — Progress Notes (Signed)
Cardiology Office Note   Date:  05/16/2016   ID:  Sarah Sarah Jul 27, 1955, MRN [id number]  Referring Doctor:  Sarah Hook, FNP   Cardiologist:   Sarah Lint, MD   Reason for consultation:  Chief Complaint  Patient presents with  . other    New Patient. Continuity of Care per [facility]. Pt c/o body aches, chills, feels weak. Reviewed meds with pt verbally.      History of Present Illness: Sarah Sarah is a 61 y.o. female who presents for Evaluation of congestive heart failure diagnosis.  Back in August, patient was complaining of cough and shortness of breath. Chest x-ray in the ER showed possible pulmonary congestion. She was started on Lasix and followed up with CHF clinic. Patient remembers taking the Lasix for about a month. She has been doing well since then. Her echo showed preserved ejection fraction.  She has not needed to take any more Lasix. Her weight has gone up and down but not really related to fluid retention. She has yet to establish care with PCP.  Main issue now is having fever and chills for a few days.Also with cough. She still has no PCP. No PND, orthopnea, edema.   ROS:  Please see the history of present illness. Aside from mentioned under HPI, all other systems are reviewed and negative.     Past Medical History:  Diagnosis Date  . CHF (congestive heart failure) (HCC)   . GERD (gastroesophageal reflux disease)     Past Surgical History:  Procedure Laterality Date  . NO PAST SURGERIES       reports that she has been smoking Cigarettes.  She has been smoking about 0.50 packs per day. She has never used smokeless tobacco. She reports that she does not drink alcohol or use drugs.   family history includes Alcoholism in her brother; Diabetes in her father; Heart failure in her father; Hypertension in her brother, brother, brother, and sister; Leukemia in her sister; Liver cancer in her mother.   Outpatient Medications Prior to  Visit  Medication Sig Dispense Refill  . ramipril (ALTACE) 5 MG capsule Take 5 mg by mouth daily.    . cetirizine (ZYRTEC) 10 MG tablet Take 10 mg by mouth daily as needed for allergies.     No facility-administered medications prior to visit.      Allergies: Patient has no known allergies.    PHYSICAL EXAM: VS:  BP 118/62 (BP Location: Right Arm, Patient Position: Sitting, Cuff Size: Normal)   Pulse 68   Ht 4\' 11"  (1.499 m)   Wt 201 lb (91.2 kg)   LMP  (LMP Unknown)   BMI 40.60 kg/m  , Body mass index is 40.6 kg/m. Wt Readings from Last 3 Encounters:  05/16/16 201 lb (91.2 kg)  04/20/16 203 lb 6 oz (92.3 kg)  03/02/16 197 lb (89.4 kg)    GENERAL:  well developed, well nourished, morbidly obese, not in acute distress HEENT: normocephalic, pink conjunctivae, anicteric sclerae, no xanthelasma, normal dentition, oropharynx clear NECK:  no neck vein engorgement, JVP normal, no hepatojugular reflux, carotid upstroke brisk and symmetric, no bruit, no thyromegaly, no lymphadenopathy LUNGS:  good respiratory effort, crackles bilateral bases CV:  PMI not displaced, no thrills, no lifts, S1 and S2 within normal limits, no palpable S3 or S4, no murmurs, no rubs, no gallops ABD:  Soft, nontender, nondistended, normoactive bowel sounds, no abdominal aortic bruit, no hepatomegaly, no splenomegaly MS: nontender back,  no kyphosis, no scoliosis, no joint deformities EXT:  2+ DP/PT pulses, no edema, no varicosities, no cyanosis, no clubbing SKIN: warm, nondiaphoretic, normal turgor, no ulcers NEUROPSYCH: alert, oriented to person, place, and time, sensory/motor grossly intact, normal mood, appropriate affect  Recent Labs: 11/05/2015: ALT 12; B Natriuretic Peptide 91.0; BUN 10; Creatinine, Ser 0.86; Hemoglobin 12.8; Platelets 183; Potassium 3.6; Sodium 140   Lipid Panel No results found for: CHOL, TRIG, HDL, CHOLHDL, VLDL, LDLCALC, LDLDIRECT   Other studies Reviewed:  EKG:  The ekg from  05/16/2016 was personally reviewed by me and it revealed sinus rhythm, 60 BPM, sinus arrhythmia. Overall unremarkable EKG.  Additional studies/ records that were reviewed personally reviewed by me today include:  Echo 11/08/2015: Left ventricle: The cavity size was normal. Systolic function was   normal. The estimated ejection fraction was in the range of 55%   to 60%. Wall motion was normal; there were no regional wall   motion abnormalities. Left ventricular diastolic function   parameters were normal. - Aortic valve: There was mild to moderate regurgitation. - Left atrium: The atrium was normal in size. - Right ventricle: Systolic function was normal. - Pulmonary arteries: Systolic pressure was within the normal   range.  ASSESSMENT AND PLAN: Fever, chills, cough Crackles bilateral bases Emphasized need to search care with PCP. Encouraged to go to PCP or urgent care to get checked out for flu. Recommend chest x-ray, crackles bilateral bases. We will forward results to PCP or urgent care if needed. Vision verbalized understanding and agreed with plan.  Hx of CHF, diastolic dysfunction No evidence of volume overload Agree with withholding Lasix for now. Patient appears euvolemic. Continue daily weights and low sodium diet.  If weight gain of > 2 lbs over 24 hours, or > 5 lbs over 1 week, please call office.   AI, mild to moderate ffup echo sometime 10/2016 when she follows up   Current medicines are reviewed at length with the patient today.  The patient does not have concerns regarding medicines.  Labs/ tests ordered today include:  Orders Placed This Encounter  Procedures  . DG Chest 2 View  . EKG 12-Lead     Disposition:   FU with Cardiology in 6 months    Signed, Sarah LintAileen Zyliah Schier, MD  05/16/2016 2:38 PM    [facility]  This note was generated in part with voice recognition software and I apologize for any typographical errors that were not  detected and corrected.

## 2016-05-16 NOTE — Patient Instructions (Signed)
Medication Instructions:  Your physician recommends that you continue on your current medications as directed. Please refer to the Current Medication list given to you today.   Labwork: none  Testing/Procedures: A chest x-ray takes a picture of the organs and structures inside the chest, including the heart, lungs, and blood vessels. This test can show several things, including, whether the heart is enlarges; whether fluid is building up in the lungs; and whether pacemaker / defibrillator leads are still in place.  - [facility]. You will check in at the front desk to the right as you walk into the atrium. Valet Parking is offered if needed.   Follow-Up: Your physician wants you to follow-up in: 6 MONTHS WITH DR Alvino ChapelINGAL. You will receive a reminder letter in the mail two months in advance. If you don't receive a letter, please call our office to schedule the follow-up appointment.  If you need a refill on your cardiac medications before your next appointment, please call your pharmacy.

## 2016-05-16 NOTE — Telephone Encounter (Signed)
Spoke with patient and reviewed results and recommendations with patient. She states that she will have the lab work done [facility] [employer] and have them fax us the results. Let her know that once we get those labs we can then possibly send in some medication but that she really needs to go to a primary care doctor or Urgent care for further evaluation. She verbalized understanding of our conversation with no further questions at this time. She will call us back when she gets the labs done.

## 2016-05-16 NOTE — Telephone Encounter (Signed)
Ok, thank you

## 2016-05-16 NOTE — Telephone Encounter (Signed)
-----   Message from Almond LintAileen Ingal, MD sent at 05/16/2016  3:32 PM EST ----- Pneumonitis or interstitial edema on cxr. As discussed during the office visit, she should follow-up ASAP with PCP or go to urgent care to be ruled out for the flu. Emphasize again that flu season is bad this year, better to be evaluated sooner rather than later.  We can do trial with lasix but she will need baseline bmp first. If bmp looks ok, then lasix 20mg  qd, x 1 week (with or without KCl depending on K from BMP).  pls fwd to PCP. She may need rpt CXR or even CT chest (defer to PCP).

## 2016-06-19 ENCOUNTER — Encounter: Payer: Self-pay | Admitting: *Deleted

## 2016-06-19 NOTE — Telephone Encounter (Signed)
Letter mailed to patient to call and schedule appointment

## 2016-06-19 NOTE — Telephone Encounter (Signed)
No answer/No voicemail is set up.

## 2016-06-20 ENCOUNTER — Ambulatory Visit: Payer: BLUE CROSS/BLUE SHIELD | Admitting: Family Medicine

## 2016-10-19 ENCOUNTER — Ambulatory Visit: Payer: BLUE CROSS/BLUE SHIELD | Admitting: Family

## 2016-10-22 ENCOUNTER — Ambulatory Visit: Payer: BLUE CROSS/BLUE SHIELD | Admitting: Family

## 2016-10-22 ENCOUNTER — Telehealth: Payer: Self-pay | Admitting: Family

## 2016-10-22 NOTE — Telephone Encounter (Signed)
Patient did not show for her [facility] appointment on 10/22/16. Will attempt to reschedule.

## 2016-11-21 ENCOUNTER — Emergency Department
Admission: EM | Admit: 2016-11-21 | Discharge: 2016-11-21 | Disposition: A | Discharge status: Home / Self Care | Payer: 59 | Attending: Emergency Medicine | Admitting: Emergency Medicine

## 2016-11-21 ENCOUNTER — Emergency Department: Payer: 59

## 2016-11-21 ENCOUNTER — Telehealth: Payer: Self-pay | Admitting: Cardiovascular Disease

## 2016-11-21 DIAGNOSIS — R55 Syncope and collapse: Secondary | ICD-10-CM

## 2016-11-21 DIAGNOSIS — R079 Chest pain, unspecified: Secondary | ICD-10-CM | POA: Insufficient documentation

## 2016-11-21 DIAGNOSIS — F17210 Nicotine dependence, cigarettes, uncomplicated: Secondary | ICD-10-CM | POA: Insufficient documentation

## 2016-11-21 DIAGNOSIS — I509 Heart failure, unspecified: Secondary | ICD-10-CM | POA: Diagnosis not present

## 2016-11-21 LAB — BASIC METABOLIC PANEL
ANION GAP: 6 (ref 5–15)
BUN: 15 mg/dL (ref 6–20)
CALCIUM: 8.8 mg/dL — AB (ref 8.9–10.3)
CHLORIDE: 106 mmol/L (ref 101–111)
CO2: 27 mmol/L (ref 22–32)
CREATININE: 1.01 mg/dL — AB (ref 0.44–1.00)
GFR calc non Af Amer: 59 mL/min — ABNORMAL LOW (ref >60)
GLUCOSE: 111 mg/dL — AB (ref 65–99)
Potassium: 3.8 mmol/L (ref 3.5–5.1)
Sodium: 139 mmol/L (ref 135–145)

## 2016-11-21 LAB — CBC
HCT: 38.9 % (ref 35.0–47.0)
HEMOGLOBIN: 13.1 g/dL (ref 12.0–16.0)
MCH: 30.5 pg (ref 26.0–34.0)
MCHC: 33.6 g/dL (ref 32.0–36.0)
MCV: 90.7 fL (ref 80.0–100.0)
Platelets: 166 10*3/uL (ref 150–440)
RBC: 4.29 MIL/uL (ref 3.80–5.20)
RDW: 15 % — ABNORMAL HIGH (ref 11.5–14.5)
WBC: 6 10*3/uL (ref 3.6–11.0)

## 2016-11-21 LAB — TROPONIN I: Troponin I: <0.03 ng/mL (ref <0.03)

## 2016-11-21 MED ORDER — SODIUM CHLORIDE 0.9 % IV BOLUS (SEPSIS)
1000.0000 mL | Freq: Once | INTRAVENOUS | Status: AC
  Administered 2016-11-21: 1000 mL via INTRAVENOUS

## 2016-11-21 NOTE — ED Notes (Signed)
Pt resting in bed with family at bedside. Will continue to monitor for further patient needs at this time. Pt requesting something to drink. Explained will have to verify with MD prior to giving patient some to drink. Pt and family state understanding. VSS at this time time.
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
## 2016-11-21 NOTE — ED Triage Notes (Signed)
Per EMS pt reports sudden head shaking follow by chest tightness, dizziness/light headedness. Pt denies SHOB. Pt given 2 sublingual nitro and 324 aspirin. Pt denies cardiac hx however states 2 yrs ago being told she had CHF, pt denies being on medication for that at this time. Pt A&O and able to answer questions.

## 2016-11-21 NOTE — ED Notes (Signed)
Pt taken to Xray at this time.

## 2016-11-21 NOTE — ED Notes (Signed)
Pt verbalizes understanding of d/c teaching. Pt and family have no further questions at this time. Pt in NAD at time of d/c, pt left in wc.

## 2016-11-21 NOTE — Telephone Encounter (Signed)
Patient wants sooner apt than 9/6 with [facility] for dizziness

## 2016-11-21 NOTE — ED Notes (Addendum)
This RN to bedside, explained waiting for MD to print D/C papers. Pt states understanding, NAD noted at this time.

## 2016-11-21 NOTE — Telephone Encounter (Signed)
Pt seen in ED today for syncope and chest pain.  Former [employer] patient. Negative troponin x 2. She was discharged w/instructions to f/u w/cardiology. She has 9/6 appt w/Dr. Kirke Corin and is agreeable to be placed on wait list for sooner appt.

## 2016-11-21 NOTE — Discharge Instructions (Signed)
Please follow-up with your cardiologist to discuss further workup and treatment such as a Holter monitor. Return to the emergency department for any chest discomfort, or any further episodes of passing out or feeling like you're going to pass out.

## 2016-11-21 NOTE — ED Notes (Signed)
Pt given orange juice per her request. Pt's IV noted to not be flowing, attempted to reposition without success. Spoke with patient about starting a new IV, pt states okay to do so. This RN started a new IV, pt requesting to get up to go to the bathroom, refused this RN assistance, requesting her family's assistance at this time. Pt up to the bathroom with family member's assistance at this time. Will continue to monitor for further patient needs.

## 2016-11-21 NOTE — ED Notes (Signed)
MD made aware pt intermittently bradycardic at a rate of 48-49BPM. Pt denies feeling symptomatic at this tim. Will continue to monitor.

## 2016-11-29 ENCOUNTER — Encounter: Payer: Self-pay | Admitting: Cardiovascular Disease

## 2016-11-29 ENCOUNTER — Ambulatory Visit (INDEPENDENT_AMBULATORY_CARE_PROVIDER_SITE_OTHER): Payer: 59 | Admitting: Cardiovascular Disease

## 2016-11-29 VITALS — BP 109/61 | HR 81 | Ht 60.0 in | Wt 205.2 lb

## 2016-11-29 DIAGNOSIS — R55 Syncope and collapse: Secondary | ICD-10-CM | POA: Diagnosis not present

## 2016-11-29 DIAGNOSIS — R079 Chest pain, unspecified: Secondary | ICD-10-CM

## 2016-11-29 DIAGNOSIS — Z720 Tobacco use: Secondary | ICD-10-CM | POA: Diagnosis not present

## 2016-11-29 NOTE — Patient Instructions (Addendum)
Medication Instructions:  Your physician recommends that you continue on your current medications as directed. Please refer to the Current Medication list given to you today.     Labwork: None  Testing/Procedures: Your physician has requested that you have a lexiscan myoview. For further information please visit [email or web address]. Please follow instruction sheet, as given.  [facility] MYOVIEW  Your caregiver has ordered a Stress Test with nuclear imaging. The purpose of this test is to evaluate the blood supply to your heart muscle. This procedure is referred to as a "Non-Invasive Stress Test." This is because other than having an IV started in your vein, nothing is inserted or "invades" your body. Cardiac stress tests are done to find areas of poor blood flow to the heart by determining the extent of coronary artery disease (CAD). Some patients exercise on a treadmill, which naturally increases the blood flow to your heart, while others who are  unable to walk on a treadmill due to physical limitations have a pharmacologic/chemical stress agent called Lexiscan . This medicine will mimic walking on a treadmill by temporarily increasing your coronary blood flow.   Please note: these test may take anywhere between 2-4 hours to complete  PLEASE REPORT TO [facility] ENTRANCE  THE VOLUNTEERS AT THE FIRST DESK WILL DIRECT YOU WHERE TO GO  Date of Procedure: Friday, Sept 7 Arrival Time for Procedure:___9:2745m___  Instructions regarding medication:   You may take your morning medications with a sip of water.   PLEASE NOTIFY THE OFFICE AT LEAST 24 HOURS IN ADVANCE IF YOU ARE UNABLE TO KEEP YOUR APPOINTMENT.  [phone] AND  PLEASE NOTIFY NUCLEAR MEDICINE AT [facility] AT LEAST 24 HOURS IN ADVANCE IF YOU ARE UNABLE TO KEEP YOUR APPOINTMENT. [phone]  How to prepare for your Myoview test:  1. Do not eat or drink after midnight 2. No caffeine for 24 hours prior to test 3. No smoking 24  hours prior to test. 4. Your medication may be taken with water.  If your doctor stopped a medication because of this test, do not take that medication. 5. Ladies, please do not wear dresses.  Skirts or pants are appropriate. Please wear a short sleeve shirt. 6. No perfume, cologne or lotion. 7. Wear comfortable walking shoes. No heels!            Follow-Up: Your physician wants you to follow-up in: 6 months with Dr. Kirke CorinArida.  You will receive a reminder letter in the mail two months in advance. If you don't receive a letter, please call our office to schedule the follow-up appointment.   Any Other Special Instructions Will Be Listed Below (If Applicable).     If you need a refill on your cardiac medications before your next appointment, please call your pharmacy.  Cardiac Nuclear Scan A cardiac nuclear scan is a test that measures blood flow to the heart when a person is resting and when he or she is exercising. The test looks for problems such as:  Not enough blood reaching a portion of the heart.  The heart muscle not working normally.  You may need this test if:  You have heart disease.  You have had abnormal lab results.  You have had heart surgery or angioplasty.  You have chest pain.  You have shortness of breath.  In this test, a radioactive dye (tracer) is injected into your bloodstream. After the tracer has traveled to your heart, an imaging device is used to measure how much of the  tracer is absorbed by or distributed to various areas of your heart. This procedure is usually done at a hospital and takes 2-4 hours. Tell a health care provider about:  Any allergies you have.  All medicines you are taking, including vitamins, herbs, eye drops, creams, and over-the-counter medicines.  Any problems you or family members have had with the use of anesthetic medicines.  Any blood disorders you have.  Any surgeries you have had.  Any medical conditions you  have.  Whether you are pregnant or may be pregnant. What are the risks? Generally, this is a safe procedure. However, problems may occur, including:  Serious chest pain and heart attack. This is only a risk if the stress portion of the test is done.  Rapid heartbeat.  Sensation of warmth in your chest. This usually passes quickly.  What happens before the procedure?  Ask your health care provider about changing or stopping your regular medicines. This is especially important if you are taking diabetes medicines or blood thinners.  Remove your jewelry on the day of the procedure. What happens during the procedure?  An IV tube will be inserted into one of your veins.  Your health care provider will inject a small amount of radioactive tracer through the tube.  You will wait for 20-40 minutes while the tracer travels through your bloodstream.  Your heart activity will be monitored with an electrocardiogram (ECG).  You will lie down on an exam table.  Images of your heart will be taken for about 15-20 minutes.  You may be asked to exercise on a treadmill or stationary bike. While you exercise, your heart's activity will be monitored with an ECG, and your blood pressure will be checked. If you are unable to exercise, you may be given a medicine to increase blood flow to parts of your heart.  When blood flow to your heart has peaked, a tracer will again be injected through the IV tube.  After 20-40 minutes, you will get back on the exam table and have more images taken of your heart.  When the procedure is over, your IV tube will be removed. The procedure may vary among health care providers and hospitals. Depending on the type of tracer used, scans may need to be repeated 3-4 hours later. What happens after the procedure?  Unless your health care provider tells you otherwise, you may return to your normal schedule, including diet, activities, and medicines.  Unless your health  care provider tells you otherwise, you may increase your fluid intake. This will help flush the contrast dye from your body. Drink enough fluid to keep your urine clear or pale yellow.  It is up to you to get your test results. Ask your health care provider, or the department that is doing the test, when your results will be ready. Summary  A cardiac nuclear scan measures the blood flow to the heart when a person is resting and when he or she is exercising.  You may need this test if you are at risk for heart disease.  Tell your health care provider if you are pregnant.  Unless your health care provider tells you otherwise, increase your fluid intake. This will help flush the contrast dye from your body. Drink enough fluid to keep your urine clear or pale yellow. This information is not intended to replace advice given to you by your health care provider. Make sure you discuss any questions you have with your health care provider. Document  Released: 04/06/2004 Document Revised: 03/14/2016 Document Reviewed: 02/18/2013 Elsevier Interactive Patient Education  2017 [employer].

## 2016-11-29 NOTE — Progress Notes (Signed)
Cardiology Office Note   Date:  11/29/2016   ID:  Sarah HongGlenda F Pustejovsky, DOB [birth year], MRN [id number]  PCP:  Patient, No Pcp Per  Cardiologist:   Lorine BearsMuhammad Bryen Hinderman, MD   Chief Complaint  Patient presents with  . other    Dizziness. Meds reviewed verbally with pt.      History of Present Illness: Sarah Mack is a 61 y.o. female who presents for A follow-up visit regarding chronic diastolic heart failure and mild to moderate aortic regurgitation. She was seen in the past by Dr.Ingal for possible chronic diastolic heart failure. She had an echocardiogram done which showed normal LV systolic function, mild to moderate aortic regurgitation and no evidence of pulmonary hypertension. Diastolic function was normal. She is a chronic smoker and smokes half a pack per day. Recently, she was driving her car and felt a sudden episode of dizziness without loss of consciousness. It was associated with substernal chest tightness. [facility]. Vitals were unremarkable. Labs were within normal range and EKG showed no significant changes.     Past Medical History:  Diagnosis Date  . CHF (congestive heart failure) (HCC)   . GERD (gastroesophageal reflux disease)   . Leaky heart valve     Past Surgical History:  Procedure Laterality Date  . NO PAST SURGERIES       Current Outpatient Prescriptions  Medication Sig Dispense Refill  . aspirin 81 MG chewable tablet Chew 81 mg by mouth daily.    . Cholecalciferol (VITAMIN D PO) Take by mouth daily.    . ramipril (ALTACE) 5 MG capsule Take 5 mg by mouth daily.     No current facility-administered medications for this visit.     Allergies:   Patient has no known allergies.    Social History:  The patient  reports that she has been smoking Cigarettes.  She has a 5.00 pack-year smoking history. She has never used smokeless tobacco. She reports that she does not drink alcohol or use drugs.   Family History:  The patient's  family history includes Alcoholism in her brother; Diabetes in her father; Heart failure in her father; Hypertension in her brother, brother, brother, and sister; Leukemia in her sister; Liver cancer in her mother.    ROS:  Please see the history of present illness.   Otherwise, review of systems are positive for none.   All other systems are reviewed and negative.    PHYSICAL EXAM: VS:  BP 109/61 (BP Location: Left Arm, Patient Position: Sitting, Cuff Size: Large)   Pulse 81   Ht 5' (1.524 m)   Wt 205 lb 4 oz (93.1 kg)   LMP  (LMP Unknown)   BMI 40.09 kg/m  , BMI Body mass index is 40.09 kg/m. GEN: Well nourished, well developed, in no acute distress  HEENT: normal  Neck: no JVD, carotid bruits, or masses Cardiac: RRR; no rubs, or gallops,no edema . One out of 6 systolic ejection murmur in the aortic area. Respiratory:  clear to auscultation bilaterally, normal work of breathing GI: soft, nontender, nondistended, + BS MS: no deformity or atrophy  Skin: warm and dry, no rash Neuro:  Strength and sensation are intact Psych: euthymic mood, full affect   EKG:  EKG is ordered today. The ekg ordered today demonstrates normal sinus rhythm with no significant ST or T wave changes.   Recent Labs: 11/21/2016: BUN 15; Creatinine, Ser 1.01; Hemoglobin 13.1; Platelets 166; Potassium 3.8;  Sodium 139    Lipid Panel No results found for: CHOL, TRIG, HDL, CHOLHDL, VLDL, LDLCALC, LDLDIRECT    Wt Readings from Last 3 Encounters:  11/29/16 205 lb 4 oz (93.1 kg)  11/21/16 195 lb (88.5 kg)  05/16/16 201 lb (91.2 kg)        PAD Screen 11/29/2016 05/16/2016  Previous PAD dx? No No  Previous surgical procedure? No No  Pain with walking? No No  Feet/toe relief with dangling? Yes Yes  Painful, non-healing ulcers? No No  Extremities discolored? No No      ASSESSMENT AND PLAN:  1.  Presyncope: She is not orthostatic. EF is known to be normal. No convincing evidence of arrhythmia.  2.  Chest pain: She had chest tightness at the time of her dizziness and given her risk factors for coronary artery disease, I recommend a pharmacologic nuclear stress test for evaluation. She is not able to exercise on a treadmill. If stress test is unremarkable, she can resume her work.  3. Tobacco use: I discussed with her the importance of smoking cessation.  Disposition:   FU with me in 6 months  Signed,  Lorine Bears, MD  11/29/2016 9:59 AM    [facility]

## 2016-11-30 ENCOUNTER — Ambulatory Visit
Admission: RE | Admit: 2016-11-30 | Discharge: 2016-11-30 | Disposition: A | Discharge status: Home / Self Care | Payer: 59 | Source: Ambulatory Visit | Attending: Cardiovascular Disease | Admitting: Cardiovascular Disease

## 2016-11-30 ENCOUNTER — Telehealth: Payer: Self-pay | Admitting: Cardiovascular Disease

## 2016-11-30 DIAGNOSIS — R079 Chest pain, unspecified: Secondary | ICD-10-CM

## 2016-11-30 LAB — NM MYOCAR MULTI W/SPECT W/WALL MOTION / EF
CHL CUP NUCLEAR SRS: 15
CHL CUP RESTING HR STRESS: 64 {beats}/min
CSEPHR: 70 %
LV dias vol: 115 mL (ref 46–106)
LVSYSVOL: 41 mL
NUC STRESS TID: 0.92
Peak HR: 112 {beats}/min
SDS: 0
SSS: 8

## 2016-11-30 MED ORDER — TECHNETIUM TC 99M TETROFOSMIN IV KIT
12.2200 | PACK | Freq: Once | INTRAVENOUS | Status: AC | PRN
  Administered 2016-11-30: 12.22 via INTRAVENOUS

## 2016-11-30 MED ORDER — TECHNETIUM TC 99M TETROFOSMIN IV KIT
30.0000 | PACK | Freq: Once | INTRAVENOUS | Status: AC | PRN
  Administered 2016-11-30: 33.009 via INTRAVENOUS

## 2016-11-30 MED ORDER — REGADENOSON 0.4 MG/5ML IV SOLN
0.4000 mg | Freq: Once | INTRAVENOUS | Status: AC
  Administered 2016-11-30: 0.4 mg via INTRAVENOUS
  Filled 2016-11-30: qty 5

## 2016-11-30 NOTE — Telephone Encounter (Signed)
Pt would like stress test results. As soon as possible. She states she cannot go back to work until she has these result. States she gave some paperwork to be faxed to her work yesterday when she was in the office.

## 2016-11-30 NOTE — Telephone Encounter (Signed)
S/w pt. Let her know that I am working to have a cardiologist review stress test today to determine when she may RTW. She understands that it could be Monday before final results are available.  She asks I call Tia Maskerenise Clapp, RN at [employer] (her employer) to update. S/w Angelique Blonderenise. Will fax paperwork when complete.

## 2016-12-03 ENCOUNTER — Telehealth: Payer: Self-pay | Admitting: Cardiovascular Disease

## 2016-12-03 NOTE — Telephone Encounter (Signed)
Reviewed with patient. See results tab

## 2016-12-03 NOTE — Telephone Encounter (Signed)
Pt would like stress test resutls

## 2016-12-04 ENCOUNTER — Telehealth: Payer: Self-pay | Admitting: Cardiovascular Disease

## 2016-12-04 NOTE — Telephone Encounter (Signed)
Pt states when she was here she gave us a form to fill out for her work, and this form was not faxed back with the letter. Please call.

## 2016-12-04 NOTE — Telephone Encounter (Signed)
Patient came to office to pick up form.  Signed and completed by Dr. Kirke CorinArida. Given to Patient.

## 2016-12-04 NOTE — Telephone Encounter (Signed)
Letter provided to pt's employer.  Will ask Dr. Kirke CorinArida to complete form and will fax to [facility] [employer] [facility]

## 2016-12-07 NOTE — Telephone Encounter (Signed)
Error

## 2017-02-06 NOTE — Telephone Encounter (Signed)
error 

## 2017-06-04 ENCOUNTER — Emergency Department
Admission: EM | Admit: 2017-06-04 | Discharge: 2017-06-04 | Disposition: A | Discharge status: Home / Self Care | Payer: 59 | Attending: Emergency Medicine | Admitting: Emergency Medicine

## 2017-06-04 ENCOUNTER — Encounter: Payer: Self-pay | Admitting: Emergency Medicine

## 2017-06-04 DIAGNOSIS — I5032 Chronic diastolic (congestive) heart failure: Secondary | ICD-10-CM | POA: Diagnosis not present

## 2017-06-04 DIAGNOSIS — F17210 Nicotine dependence, cigarettes, uncomplicated: Secondary | ICD-10-CM | POA: Insufficient documentation

## 2017-06-04 DIAGNOSIS — F419 Anxiety disorder, unspecified: Secondary | ICD-10-CM

## 2017-06-04 DIAGNOSIS — F410 Panic disorder [episodic paroxysmal anxiety] without agoraphobia: Secondary | ICD-10-CM | POA: Diagnosis not present

## 2017-06-04 DIAGNOSIS — I110 Hypertensive heart disease with heart failure: Secondary | ICD-10-CM | POA: Insufficient documentation

## 2017-06-04 DIAGNOSIS — R42 Dizziness and giddiness: Secondary | ICD-10-CM | POA: Diagnosis present

## 2017-06-04 MED ORDER — ALPRAZOLAM 0.25 MG PO TABS: 0.2500 mg | ORAL_TABLET | Freq: Every evening | ORAL | 0 refills | Status: DC | PRN

## 2017-06-04 MED ORDER — CITALOPRAM HYDROBROMIDE 20 MG PO TABS: 20.0000 mg | ORAL_TABLET | Freq: Every day | ORAL | 3 refills | Status: DC

## 2017-06-04 NOTE — ED Triage Notes (Signed)
Pt reports was at work and someone made her mad and she got dizzy and started shaking and became anxious.

## 2017-06-04 NOTE — Discharge Instructions (Signed)
Follow-up with your regular doctor on Monday for your already scheduled appointment.  Let him know that we gave you Xanax for panic attacks. You have been started on a daily medication called Celexa.  Return to the emergency department if you develop chest pain or shortness of breath.

## 2017-06-04 NOTE — ED Notes (Signed)
See triage note  States she had a 3 min dizzy spell at work  And then had some "shaking"  Now feels much better

## 2017-06-04 NOTE — ED Provider Notes (Signed)
[facility] Emergency Department Provider Note  ____________________________________________   First MD Initiated Contact with Patient 06/04/17 1020     (approximate)  I have reviewed the triage vital signs and the nursing notes.   HISTORY  Chief Complaint Dizziness and Anxiety    HPI Sarah Mack is a 62 y.o. female who presents to the emergency department.  She states she got angry with a lead person at her work.  She states that as she got angry her lips went numb she started breathing hard she got dizzy and started shaking.  Now she feels much better.  She states she does not have any chest pain or shortness of breath.  Numbness and tingling around her lips have gone away.  She feels much more calm.  She has an appointment with her regular doctor on Monday.  She states she is been having a lot of stress at work.  She only has 1 more year before she can retire she is trying to make it through.  Past Medical History:  Diagnosis Date  . CHF (congestive heart failure) (HCC)   . GERD (gastroesophageal reflux disease)   . Leaky heart valve     Patient Active Problem List   Diagnosis Date Noted  . Hypotension 12/19/2015  . Chronic diastolic heart failure (HCC) 11/17/2015  . Tobacco use 11/17/2015  . Aortic insufficiency 11/17/2015  . Tachycardia 11/17/2015    Past Surgical History:  Procedure Laterality Date  . NO PAST SURGERIES      Prior to Admission medications   Medication Sig Start Date End Date Taking? Authorizing Provider  ALPRAZolam (XANAX) 0.25 MG tablet Take 1 tablet (0.25 mg total) by mouth at bedtime as needed for anxiety. 06/04/17   Sherrie Mustache Roselyn Bering, PA-C  aspirin 81 MG chewable tablet Chew 81 mg by mouth daily.    [provider]  Cholecalciferol (VITAMIN D PO) Take by mouth daily.    [provider]  citalopram (CELEXA) 20 MG tablet Take 1 tablet (20 mg total) by mouth daily. 06/04/17   Paysley Poplar, Roselyn Bering, PA-C    ramipril (ALTACE) 5 MG capsule Take 5 mg by mouth daily.    [provider]    Allergies Patient has no known allergies.  Family History  Problem Relation Age of Onset  . Liver cancer Mother   . Heart failure Father   . Diabetes Father   . Hypertension Sister   . Hypertension Brother   . Leukemia Sister   . Hypertension Brother   . Alcoholism Brother   . Hypertension Brother     Social History Social History   Tobacco Use  . Smoking status: Current Every Day Smoker    Packs/day: 0.50    Years: 10.00    Pack years: 5.00    Types: Cigarettes  . Smokeless tobacco: Never Used  Substance Use Topics  . Alcohol use: No  . Drug use: No    Review of Systems  Constitutional: No fever/chills, positive for anxiety Eyes: No visual changes. ENT: No sore throat. Respiratory: Denies cough Cardiovascular: Denies chest pain or shortness of breath Genitourinary: Negative for dysuria. Musculoskeletal: Negative for back pain. Skin: Negative for rash.    ____________________________________________   PHYSICAL EXAM:  VITAL SIGNS: ED Triage Vitals  Enc Vitals Group     BP 06/04/17 0908 (!) 157/134     Pulse Rate 06/04/17 0908 (!) 58     Resp 06/04/17 0908 20  Temp 06/04/17 0908 (!) 97.5 F (36.4 C)     Temp Source 06/04/17 0908 Oral     SpO2 06/04/17 0908 97 %     Weight 06/04/17 0909 211 lb (95.7 kg)     Height 06/04/17 0909 5' (1.524 m)     Head Circumference --      Peak Flow --      Pain Score --      Pain Loc --      Pain Edu? --      Excl. in GC? --     Constitutional: Alert and oriented. Well appearing and in no acute distress.  Patient's blood pressure decreased from 157/134 to 118/50 within 2-3 minutes of arrival. Eyes: Conjunctivae are normal.  Head: Atraumatic. Nose: No congestion/rhinnorhea. Mouth/Throat: Mucous membranes are moist.   Cardiovascular: Normal rate, regular rhythm.  Heart sounds are normal Respiratory: Normal respiratory  effort.  No retractions, lungs clear to auscultation GU: deferred Musculoskeletal: FROM all extremities, warm and well perfused Neurologic:  Normal speech and language.  No slurred speech.  Patient is acting appropriately and is able to answer all questions in an appropriate manner Skin:  Skin is warm, dry and intact. No rash noted. Psychiatric: Mood and affect are normal. Speech and behavior are normal.  ____________________________________________   LABS (all labs ordered are listed, but only abnormal results are displayed)  Labs Reviewed - No data to display ____________________________________________   ____________________________________________  RADIOLOGY    ____________________________________________   PROCEDURES  Procedure(s) performed: EKG read by DR Cyril Loosen, nsr  Procedures    ____________________________________________   INITIAL IMPRESSION / ASSESSMENT AND PLAN / ED COURSE  Pertinent labs & imaging results that were available during my care of the patient were reviewed by me and considered in my medical decision making (see chart for details).  Patient is 41-year-old female presents emergency department after a panic attack at work.  During the discussion with her she is having a lot of stress and anxiety due to changes at work.  She is concerned that she keeps having these panic attacks while she is there.  She had one on the way to work the other day.    On physical exam patient appears well.  Exam is benign  Diagnosis is acute anxiety with panic attack  Patient was given a prescription of Xanax 0.25 to take at onset of panic attack.  She is given Celexa is a daily medication.  She is to follow-up with her regular doctor on Monday to discuss these medications.  She was given a work note to remain home until she is been evaluated by her regular doctor on Monday.  The patient is appreciative.  She was discharged in stable condition with her family.       As part of my medical decision making, I reviewed the following data within the electronic MEDICAL RECORD NUMBER Nursing notes reviewed and incorporated, Notes from prior ED visits and [place] Controlled Substance Database, EKG read by Dr Cyril Loosen  ____________________________________________   FINAL CLINICAL IMPRESSION(S) / ED DIAGNOSES  Final diagnoses:  Panic attack  Anxiety      NEW MEDICATIONS STARTED DURING THIS VISIT:  Discharge Medication List as of 06/04/2017 10:36 AM    START taking these medications   Details  ALPRAZolam (XANAX) 0.25 MG tablet Take 1 tablet (0.25 mg total) by mouth at bedtime as needed for anxiety., Starting Tue 06/04/2017, Print    citalopram (CELEXA) 20 MG tablet Take 1 tablet (20  mg total) by mouth daily., Starting Tue 06/04/2017, Print         Note:  This document was prepared using Dragon voice recognition software and may include unintentional dictation errors.    Faythe GheeFisher, Hamilton Marinello W, PA-C 06/04/17 1131    Emily FilbertWilliams, Jonathan E, MD 06/04/17 [phone]

## 2017-06-04 NOTE — ED Notes (Signed)
Pt reports starting to calm down.

## 2017-07-09 ENCOUNTER — Telehealth: Payer: Self-pay

## 2017-07-09 ENCOUNTER — Other Ambulatory Visit: Payer: Self-pay

## 2017-07-09 DIAGNOSIS — Z86010 Personal history of colonic polyps: Secondary | ICD-10-CM

## 2017-07-09 NOTE — Telephone Encounter (Signed)
Gastroenterology Pre-Procedure Review  Request Date: 07/30/17 Requesting Physician: Dr. Servando SnareWohl  PATIENT REVIEW QUESTIONS: The patient responded to the following health history questions as indicated:    1. Are you having any GI issues? no 2. Do you have a personal history of Polyps? yes 3. Do you have a family history of Colon Cancer or Polyps? no 4. Diabetes Mellitus? no 5. Joint replacements in the past 12 months?no 6. Major health problems in the past 3 months?no 7. Any artificial heart valves, MVP, or defibrillator?no    MEDICATIONS & ALLERGIES:    Patient reports the following regarding taking any anticoagulation/antiplatelet therapy:   Plavix, Coumadin, Eliquis, Xarelto, Lovenox, Pradaxa, Brilinta, or Effient? no Aspirin? yes (81 mg daily)  Patient confirms/reports the following medications:  Current Outpatient Medications  Medication Sig Dispense Refill  . ALPRAZolam (XANAX) 0.25 MG tablet Take 1 tablet (0.25 mg total) by mouth at bedtime as needed for anxiety. 15 tablet 0  . aspirin 81 MG chewable tablet Chew 81 mg by mouth daily.    . Cholecalciferol (VITAMIN D PO) Take by mouth daily.    . citalopram (CELEXA) 20 MG tablet Take 1 tablet (20 mg total) by mouth daily. 30 tablet 3  . ramipril (ALTACE) 5 MG capsule Take 5 mg by mouth daily.     No current facility-administered medications for this visit.     Patient confirms/reports the following allergies:  No Known Allergies  No orders of the defined types were placed in this encounter.   AUTHORIZATION INFORMATION Primary Insurance: 1D#: Group #:  Secondary Insurance: 1D#: Group #:  SCHEDULE INFORMATION: Date: 07/30/17 Time: Location:[facility]

## 2017-07-14 ENCOUNTER — Other Ambulatory Visit: Payer: Self-pay

## 2017-07-14 ENCOUNTER — Emergency Department
Admission: EM | Admit: 2017-07-14 | Discharge: 2017-07-14 | Disposition: A | Discharge status: Home / Self Care | Payer: 59 | Attending: Emergency Medicine | Admitting: Emergency Medicine

## 2017-07-14 ENCOUNTER — Encounter: Payer: Self-pay | Admitting: Emergency Medicine

## 2017-07-14 DIAGNOSIS — Z7982 Long term (current) use of aspirin: Secondary | ICD-10-CM | POA: Insufficient documentation

## 2017-07-14 DIAGNOSIS — L0100 Impetigo, unspecified: Secondary | ICD-10-CM

## 2017-07-14 DIAGNOSIS — I5032 Chronic diastolic (congestive) heart failure: Secondary | ICD-10-CM | POA: Insufficient documentation

## 2017-07-14 DIAGNOSIS — Z79899 Other long term (current) drug therapy: Secondary | ICD-10-CM | POA: Insufficient documentation

## 2017-07-14 DIAGNOSIS — L03213 Periorbital cellulitis: Secondary | ICD-10-CM | POA: Insufficient documentation

## 2017-07-14 DIAGNOSIS — F17210 Nicotine dependence, cigarettes, uncomplicated: Secondary | ICD-10-CM | POA: Insufficient documentation

## 2017-07-14 DIAGNOSIS — H02843 Edema of right eye, unspecified eyelid: Secondary | ICD-10-CM | POA: Diagnosis present

## 2017-07-14 MED ORDER — AMOXICILLIN-POT CLAVULANATE 875-125 MG PO TABS: 1.0000 | ORAL_TABLET | Freq: Two times a day (BID) | ORAL | 0 refills | Status: AC

## 2017-07-14 MED ORDER — AMOXICILLIN-POT CLAVULANATE 875-125 MG PO TABS
1.0000 | ORAL_TABLET | Freq: Once | ORAL | Status: AC
  Administered 2017-07-14: 1 via ORAL
  Filled 2017-07-14: qty 1

## 2017-07-14 NOTE — Discharge Instructions (Signed)
Please stop taking clindamycin and take Augmentin instead.  Follow-up with your primary care physician in 2 days for recheck and return to the emergency department sooner for any concerns.  It was a pleasure to take care of you today, and thank you for coming to our emergency department.  If you have any questions or concerns before leaving please ask the nurse to grab me and I'm more than happy to go through your aftercare instructions again.  If you were prescribed any opioid pain medication today such as Norco, Vicodin, Percocet, morphine, hydrocodone, or oxycodone please make sure you do not drive when you are taking this medication as it can alter your ability to drive safely.  If you have any concerns once you are home that you are not improving or are in fact getting worse before you can make it to your follow-up appointment, please do not hesitate to call [phone] and come back for further evaluation.  Merrily BrittleNeil Koula Venier, MD

## 2017-07-14 NOTE — ED Triage Notes (Signed)
Pt ambulatory to triage with no difficulty. Pt reports she was seen at urgent care on Thursday for swelling in her right eye and a rash on her face. Put on antibiotic for "infection in my eye" and given a cream for the rash on her face. Pt reports her eye got a little better but the rash seems to be worse and now is making her eye swell worse. Pt denies shortness of breath. Talking in full and complete sentences with no difficulty.

## 2017-07-14 NOTE — ED Provider Notes (Signed)
[facility] Emergency Department Provider Note  ____________________________________________   First MD Initiated Contact with Patient 07/14/17 [phone]     (approximate)  I have reviewed the triage vital signs and the nursing notes.   HISTORY  Chief Complaint Allergies and Facial Swelling   HPI Sarah HongGlenda F Mack is a 62 y.o. female who self presents to the emergency department with several days of gradual onset slowly progressive now moderate severity swelling on her right eye and a rash across her face.  She said she went to an urgent care several days ago and was put on clindamycin for a "infection in my eye".  She denies chest pain or shortness of breath.  She denies double vision or blurred vision.  She denies headache.  She denies trauma.  She feels like that rash on her face is slowly progressive.  Past Medical History:  Diagnosis Date  . CHF (congestive heart failure) (HCC)   . GERD (gastroesophageal reflux disease)   . Leaky heart valve     Patient Active Problem List   Diagnosis Date Noted  . Hypotension 12/19/2015  . Chronic diastolic heart failure (HCC) 11/17/2015  . Tobacco use 11/17/2015  . Aortic insufficiency 11/17/2015  . Tachycardia 11/17/2015    Past Surgical History:  Procedure Laterality Date  . NO PAST SURGERIES      Prior to Admission medications   Medication Sig Start Date End Date Taking? Authorizing Provider  ALPRAZolam (XANAX) 0.25 MG tablet Take 1 tablet (0.25 mg total) by mouth at bedtime as needed for anxiety. 06/04/17  Yes Fisher, Roselyn BeringSusan W, PA-C  aspirin 81 MG chewable tablet Chew 81 mg by mouth daily.   Yes [provider]  citalopram (CELEXA) 20 MG tablet Take 1 tablet (20 mg total) by mouth daily. 06/04/17  Yes Fisher, Roselyn BeringSusan W, PA-C  montelukast (SINGULAIR) 10 MG tablet Take 10 mg by mouth daily.   Yes [provider]  mupirocin ointment (BACTROBAN) 2 % Apply 1 application topically 3 (three) times  daily.   Yes [provider]  ramipril (ALTACE) 5 MG capsule Take 5 mg by mouth daily.   Yes [provider]  VOLTAREN 1 % GEL Apply 2 g topically every 8 (eight) hours as needed (for knee pain).    Yes [provider]  amoxicillin-clavulanate (AUGMENTIN) 875-125 MG tablet Take 1 tablet by mouth 2 (two) times daily for 10 days. 07/14/17 07/24/17  Merrily Brittleifenbark, Kambra Beachem, MD    Allergies Patient has no known allergies.  Family History  Problem Relation Age of Onset  . Liver cancer Mother   . Heart failure Father   . Diabetes Father   . Hypertension Sister   . Hypertension Brother   . Leukemia Sister   . Hypertension Brother   . Alcoholism Brother   . Hypertension Brother     Social History Social History   Tobacco Use  . Smoking status: Current Every Day Smoker    Packs/day: 0.50    Years: 10.00    Pack years: 5.00    Types: Cigarettes  . Smokeless tobacco: Never Used  Substance Use Topics  . Alcohol use: No  . Drug use: No    Review of Systems Constitutional: No fever/chills Eyes: Positive for swelling around her right eye ENT: No sore throat. Cardiovascular: Denies chest pain. Respiratory: Denies shortness of breath. Gastrointestinal: No abdominal pain.  No nausea, no vomiting.  No diarrhea.  No constipation. Genitourinary: Negative for dysuria. Musculoskeletal: Negative for back  pain. Skin: Positive for rash. Neurological: Negative for headaches, focal weakness or numbness.   ____________________________________________   PHYSICAL EXAM:  VITAL SIGNS: ED Triage Vitals  Enc Vitals Group     BP 07/14/17 0452 129/73     Pulse Rate 07/14/17 0452 79     Resp 07/14/17 0452 18     Temp 07/14/17 0452 97.8 F (36.6 C)     Temp Source 07/14/17 0452 Oral     SpO2 07/14/17 0452 97 %     Weight 07/14/17 0447 211 lb (95.7 kg)     Height 07/14/17 0447 4\' 11"  (1.499 m)     Head Circumference --      Peak Flow --      Pain Score 07/14/17 0447 9      Pain Loc --      Pain Edu? --      Excl. in GC? --     Constitutional: Alert and oriented x4 pleasant cooperative speaks in full clear sentences no diaphoresis Eyes: PERRL EOMI. right-sided periorbital edema with slight erythema Head: Atraumatic. Nose: No congestion/rhinnorhea. Mouth/Throat: No trismus Neck: No stridor.   Cardiovascular: Normal rate, regular rhythm. Grossly normal heart sounds.  Good peripheral circulation. Respiratory: Normal respiratory effort.  No retractions. Lungs CTAB and moving good air Musculoskeletal: No lower extremity edema   Neurologic:  Normal speech and language. No gross focal neurologic deficits are appreciated. Skin: Right periorbital edema consistent with periorbital cellulitis Honey crusting lesions across her anterior right face consistent with impetigo Psychiatric: Mood and affect are normal. Speech and behavior are normal.    ____________________________________________   DIFFERENTIAL includes but not limited to  Periorbital cellulitis, orbital cellulitis, impetigo ____________________________________________   LABS (all labs ordered are listed, but only abnormal results are displayed)  Labs Reviewed - No data to display   __________________________________________  EKG   ____________________________________________  RADIOLOGY   ____________________________________________   PROCEDURES  Procedure(s) performed: no  Procedures  Critical Care performed: no  Observation: no ____________________________________________   INITIAL IMPRESSION / ASSESSMENT AND PLAN / ED COURSE  Pertinent labs & imaging results that were available during my care of the patient were reviewed by me and considered in my medical decision making (see chart for details).  Patient is hemodynamically stable and well-appearing.  The rash around her right eye is most consistent with periorbital cellulitis and she has no discomfort and has full range  of motion her eyes.  Clindamycin is typically inadequate to cover this so I will treat her with Augmentin instead.  She is discharged home in improved condition verbalizes understanding agreement plan with strict return precautions given.      ____________________________________________   FINAL CLINICAL IMPRESSION(S) / ED DIAGNOSES  Final diagnoses:  Preseptal cellulitis of right eye  Impetigo      NEW MEDICATIONS STARTED DURING THIS VISIT:  Discharge Medication List as of 07/14/2017  6:16 AM    START taking these medications   Details  amoxicillin-clavulanate (AUGMENTIN) 875-125 MG tablet Take 1 tablet by mouth 2 (two) times daily for 10 days., Starting Sun 07/14/2017, Until Wed 07/24/2017, Print         Note:  This document was prepared using Dragon voice recognition software and may include unintentional dictation errors.     Merrily Brittle, MD 07/18/17 [phone]

## 2017-07-18 ENCOUNTER — Telehealth: Payer: Self-pay | Admitting: Gastroenterology

## 2017-07-18 NOTE — Telephone Encounter (Signed)
Pt left vm to reschedule her procedure °

## 2017-07-23 NOTE — Telephone Encounter (Signed)
Pt left another vm to reschedule her procedure from 05/6 to 5/14 please call pt

## 2017-07-23 NOTE — Telephone Encounter (Signed)
Pt's procedure has been moved to 08/06/17.

## 2017-08-06 ENCOUNTER — Encounter: Payer: Self-pay | Admitting: *Deleted

## 2017-08-06 ENCOUNTER — Ambulatory Visit: Payer: 59 | Admitting: Anesthesiology

## 2017-08-06 ENCOUNTER — Ambulatory Visit
Admission: RE | Admit: 2017-08-06 | Discharge: 2017-08-06 | Disposition: A | Discharge status: Home / Self Care | Payer: 59 | Source: Ambulatory Visit | Attending: Gastroenterology | Admitting: Gastroenterology

## 2017-08-06 ENCOUNTER — Encounter
Admission: RE | Disposition: A | Discharge status: Home / Self Care | Payer: Self-pay | Source: Ambulatory Visit | Attending: Gastroenterology

## 2017-08-06 DIAGNOSIS — Z1211 Encounter for screening for malignant neoplasm of colon: Secondary | ICD-10-CM | POA: Insufficient documentation

## 2017-08-06 DIAGNOSIS — F17210 Nicotine dependence, cigarettes, uncomplicated: Secondary | ICD-10-CM | POA: Insufficient documentation

## 2017-08-06 DIAGNOSIS — Z6841 Body Mass Index (BMI) 40.0 and over, adult: Secondary | ICD-10-CM | POA: Diagnosis not present

## 2017-08-06 DIAGNOSIS — Z860100 Personal history of colon polyps, unspecified: Secondary | ICD-10-CM

## 2017-08-06 DIAGNOSIS — I509 Heart failure, unspecified: Secondary | ICD-10-CM | POA: Diagnosis not present

## 2017-08-06 DIAGNOSIS — Z791 Long term (current) use of non-steroidal anti-inflammatories (NSAID): Secondary | ICD-10-CM | POA: Diagnosis not present

## 2017-08-06 DIAGNOSIS — Z7982 Long term (current) use of aspirin: Secondary | ICD-10-CM | POA: Diagnosis not present

## 2017-08-06 DIAGNOSIS — Z79899 Other long term (current) drug therapy: Secondary | ICD-10-CM | POA: Diagnosis not present

## 2017-08-06 HISTORY — PX: COLONOSCOPY WITH PROPOFOL: SHX5780

## 2017-08-06 SURGERY — COLONOSCOPY WITH PROPOFOL
Anesthesia: General

## 2017-08-06 MED ORDER — LIDOCAINE HCL (PF) 1 % IJ SOLN
INTRAMUSCULAR | Status: AC
  Administered 2017-08-06: 0.3 mL via INTRADERMAL
  Filled 2017-08-06: qty 2

## 2017-08-06 MED ORDER — SODIUM CHLORIDE 0.9 % IV SOLN
INTRAVENOUS | Status: DC
  Administered 2017-08-06: 09:00:00 via INTRAVENOUS

## 2017-08-06 MED ORDER — LIDOCAINE HCL (CARDIAC) PF 100 MG/5ML IV SOSY
PREFILLED_SYRINGE | INTRAVENOUS | Status: DC | PRN
  Administered 2017-08-06: 100 mg via INTRAVENOUS

## 2017-08-06 MED ORDER — PROPOFOL 10 MG/ML IV BOLUS
INTRAVENOUS | Status: AC
  Filled 2017-08-06: qty 40

## 2017-08-06 MED ORDER — PROPOFOL 10 MG/ML IV BOLUS
INTRAVENOUS | Status: DC | PRN
  Administered 2017-08-06: 80 mg via INTRAVENOUS
  Administered 2017-08-06: 40 mg via INTRAVENOUS

## 2017-08-06 MED ORDER — LIDOCAINE HCL (PF) 1 % IJ SOLN
2.0000 mL | Freq: Once | INTRAMUSCULAR | Status: AC
  Administered 2017-08-06: 0.3 mL via INTRADERMAL

## 2017-08-06 NOTE — Op Note (Signed)
[facility] Gastroenterology Patient Name: Sarah Mack Procedure Date: 08/06/2017 8:59 AM MRN: [id number] Account #: [id number] Date of Birth: May 30, 1955 Admit Type: Outpatient Age: 62 Room: [facility] ENDO ROOM 4 Gender: Female Note Status: Finalized Procedure:            Colonoscopy Indications:          High risk colon cancer surveillance: Personal history                        of colonic polyps Providers:            Midge Minium MD, MD Referring MD:         No Local Md, MD (Referring MD) Medicines:            Propofol per Anesthesia Complications:        No immediate complications. Procedure:            Pre-Anesthesia Assessment:                       - Prior to the procedure, a History and Physical was                        performed, and patient medications and allergies were                        reviewed. The patient's tolerance of previous                        anesthesia was also reviewed. The risks and benefits of                        the procedure and the sedation options and risks were                        discussed with the patient. All questions were                        answered, and informed consent was obtained. Prior                        Anticoagulants: The patient has taken no previous                        anticoagulant or antiplatelet agents. ASA Grade                        Assessment: II - A patient with mild systemic disease.                        After reviewing the risks and benefits, the patient was                        deemed in satisfactory condition to undergo the                        procedure.                       After obtaining informed consent, the colonoscope was  passed under direct vision. Throughout the procedure,                        the patient's blood pressure, pulse, and oxygen                        saturations were monitored continuously. The                        Colonoscope was  introduced through the anus and                        advanced to the the cecum, identified by appendiceal                        orifice and ileocecal valve. The colonoscopy was                        performed without difficulty. The patient tolerated the                        procedure well. The quality of the bowel preparation                        was 50 percent obscured. Findings:      The perianal and digital rectal examinations were normal.      A large amount of stool was found in the entire colon, precluding       visualization. Impression:           - Stool in the entire examined colon.                       - No specimens collected. Recommendation:       - Discharge patient to home.                       - Resume previous diet.                       - Continue present medications.                       - Repeat colonoscopy at appointment to be scheduled                        because the bowel preparation was poor. Procedure Code(s):    --- Professional ---                       [phone], Colonoscopy, flexible; diagnostic, including                        collection of specimen(s) by brushing or washing, when                        performed (separate procedure) Diagnosis Code(s):    --- Professional ---                       Z86.010, Personal history of colonic polyps CPT copyright 2017 [employer]. All rights reserved. The codes documented in this report are preliminary and upon coder review may  be revised to meet current compliance requirements. Midge Minium MD, MD 08/06/2017 9:10:42 AM This report has been signed electronically. Number of Addenda: 0 Note Initiated On: 08/06/2017 8:59 AM Scope Withdrawal Time: 0 hours 2 minutes 12 seconds  Total Procedure Duration: 0 hours 3 minutes 49 seconds       [facility]

## 2017-08-06 NOTE — H&P (Signed)
Midge Minium, MD [facility] [address], [address] [place], [place] [address] [phone] Fax : [phone]  Primary Care Physician:  [facility] Primary Gastroenterologist:  Dr. Servando Snare  Pre-Procedure History & Physical: HPI:  Sarah Mack is a 62 y.o. female is here for an colonoscopy.   Past Medical History:  Diagnosis Date  . CHF (congestive heart failure) (HCC)   . GERD (gastroesophageal reflux disease)   . Leaky heart valve     Past Surgical History:  Procedure Laterality Date  . NO PAST SURGERIES      Prior to Admission medications   Medication Sig Start Date End Date Taking? Authorizing Provider  ALPRAZolam (XANAX) 0.25 MG tablet Take 1 tablet (0.25 mg total) by mouth at bedtime as needed for anxiety. 06/04/17  Yes Fisher, Roselyn Bering, PA-C  aspirin 81 MG chewable tablet Chew 81 mg by mouth daily.   Yes [provider]  citalopram (CELEXA) 20 MG tablet Take 1 tablet (20 mg total) by mouth daily. 06/04/17  Yes Fisher, Roselyn Bering, PA-C  montelukast (SINGULAIR) 10 MG tablet Take 10 mg by mouth daily.   Yes [provider]  mupirocin ointment (BACTROBAN) 2 % Apply 1 application topically 3 (three) times daily.   Yes [provider]  ramipril (ALTACE) 5 MG capsule Take 5 mg by mouth daily.   Yes [provider]  VOLTAREN 1 % GEL Apply 2 g topically every 8 (eight) hours as needed (for knee pain).    Yes [provider]    Allergies as of 07/09/2017  . (No Known Allergies)    Family History  Problem Relation Age of Onset  . Liver cancer Mother   . Heart failure Father   . Diabetes Father   . Hypertension Sister   . Hypertension Brother   . Leukemia Sister   . Hypertension Brother   . Alcoholism Brother   . Hypertension Brother     Social History   Socioeconomic History  . Marital status: Legally Separated    Spouse name: Not on file  . Number of children: Not on file  . Years of education: Not on file  .  Highest education level: Not on file  Occupational History  . Not on file  Social Needs  . Financial resource strain: Not on file  . Food insecurity:    Worry: Not on file    Inability: Not on file  . Transportation needs:    Medical: Not on file    Non-medical: Not on file  Tobacco Use  . Smoking status: Current Every Day Smoker    Packs/day: 0.50    Years: 10.00    Pack years: 5.00    Types: Cigarettes  . Smokeless tobacco: Never Used  Substance and Sexual Activity  . Alcohol use: No  . Drug use: No  . Sexual activity: Not on file  Lifestyle  . Physical activity:    Days per week: Not on file    Minutes per session: Not on file  . Stress: Not on file  Relationships  . Social connections:    Talks on phone: Not on file    Gets together: Not on file    Attends religious service: Not on file    Active member of club or organization: Not on file    Attends meetings of clubs or organizations: Not on file    Relationship status: Not on file  . Intimate partner violence:    Fear of current  or ex partner: Not on file    Emotionally abused: Not on file    Physically abused: Not on file    Forced sexual activity: Not on file  Other Topics Concern  . Not on file  Social History Narrative  . Not on file    Review of Systems: See HPI, otherwise negative ROS  Physical Exam: BP 118/78   Pulse 91   Temp (!) 96.5 F (35.8 C) (Tympanic)   Resp 20   Ht 5' (1.524 m)   Wt 215 lb (97.5 kg)   LMP  (LMP Unknown)   SpO2 100%   BMI 41.99 kg/m  General:   Alert,  pleasant and cooperative in NAD Head:  Normocephalic and atraumatic. Neck:  Supple; no masses or thyromegaly. Lungs:  Clear throughout to auscultation.    Heart:  Regular rate and rhythm. Abdomen:  Soft, nontender and nondistended. Normal bowel sounds, without guarding, and without rebound.   Neurologic:  Alert and  oriented x4;  grossly normal neurologically.  Impression/Plan: Sarah Mack is here for an  colonoscopy to be performed for history of polyps  Risks, benefits, limitations, and alternatives regarding  colonoscopy have been reviewed with the patient.  Questions have been answered.  All parties agreeable.   Midge Minium, MD  08/06/2017, 8:50 AM

## 2017-08-06 NOTE — Anesthesia Postprocedure Evaluation (Signed)
Anesthesia Post Note  Patient: Sarah Mack  Procedure(s) Performed: COLONOSCOPY WITH PROPOFOL (N/A )  Patient location during evaluation: Endoscopy Anesthesia Type: General Level of consciousness: awake and alert Pain management: pain level controlled Vital Signs Assessment: post-procedure vital signs reviewed and stable Respiratory status: spontaneous breathing, nonlabored ventilation, respiratory function stable and patient connected to nasal cannula oxygen Cardiovascular status: blood pressure returned to baseline and stable Postop Assessment: no apparent nausea or vomiting Anesthetic complications: no     Last Vitals:  Vitals:   08/06/17 0930 08/06/17 0940  BP: 116/62 119/62  Pulse: 68   Resp: (!) 21 (!) 21  Temp:    SpO2: 100% 100%    Last Pain:  Vitals:   08/06/17 0910  TempSrc: Tympanic  PainSc:                  Lenard Simmer

## 2017-08-06 NOTE — Transfer of Care (Signed)
Immediate Anesthesia Transfer of Care Note  Patient: Sarah Mack  Procedure(s) Performed: COLONOSCOPY WITH PROPOFOL (N/A )  Patient Location: PACU  Anesthesia Type:General  Level of Consciousness: awake, alert  and oriented  Airway & Oxygen Therapy: Patient Spontanous Breathing and Patient connected to nasal cannula oxygen  Post-op Assessment: Report given to RN and Post -op Vital signs reviewed and stable  Post vital signs: Reviewed and stable  Last Vitals:  Vitals Value Taken Time  BP 98/81 08/06/2017  9:14 AM  Temp    Pulse 87 08/06/2017  9:15 AM  Resp 9 08/06/2017  9:15 AM  SpO2 100 % 08/06/2017  9:15 AM  Vitals shown include unvalidated device data.  Last Pain:  Vitals:   08/06/17 0824  TempSrc: Tympanic  PainSc: 0-No pain         Complications: No apparent anesthesia complications

## 2017-08-06 NOTE — Anesthesia Preprocedure Evaluation (Signed)
Anesthesia Evaluation  Patient identified by MRN, date of birth, ID band Patient awake    Reviewed: Allergy & Precautions, H&P , NPO status , Patient's Chart, lab work & pertinent test results, reviewed documented beta blocker date and time   History of Anesthesia Complications Negative for: history of anesthetic complications  Airway Mallampati: III  TM Distance: >3 FB Neck ROM: full    Dental  (+) Edentulous Upper, Edentulous Lower   Pulmonary neg shortness of breath, asthma (as a child) , neg sleep apnea, neg COPD, neg recent URI, Current Smoker,           Cardiovascular Exercise Tolerance: Good (-) angina+CHF  (-) CAD, (-) Past MI, (-) Cardiac Stents and (-) CABG (-) dysrhythmias + Valvular Problems/Murmurs AI      Neuro/Psych negative neurological ROS  negative psych ROS   GI/Hepatic Neg liver ROS, GERD  ,  Endo/Other  neg diabetesMorbid obesity  Renal/GU negative Renal ROS  negative genitourinary   Musculoskeletal   Abdominal   Peds  Hematology negative hematology ROS (+)   Anesthesia Other Findings Past Medical History: No date: CHF (congestive heart failure) (HCC) No date: GERD (gastroesophageal reflux disease) No date: Leaky heart valve   Reproductive/Obstetrics negative OB ROS                             Anesthesia Physical Anesthesia Plan  ASA: III  Anesthesia Plan: General   Post-op Pain Management:    Induction: Intravenous  PONV Risk Score and Plan: 2 and Propofol infusion  Airway Management Planned: Nasal Cannula  Additional Equipment:   Intra-op Plan:   Post-operative Plan:   Informed Consent: I have reviewed the patients History and Physical, chart, labs and discussed the procedure including the risks, benefits and alternatives for the proposed anesthesia with the patient or authorized representative who has indicated his/her understanding and acceptance.    Dental Advisory Given  Plan Discussed with: Anesthesiologist, CRNA and Surgeon  Anesthesia Plan Comments:         Anesthesia Quick Evaluation

## 2017-08-06 NOTE — Anesthesia Post-op Follow-up Note (Signed)
Anesthesia QCDR form completed.        

## 2017-08-07 ENCOUNTER — Encounter: Payer: Self-pay | Admitting: Gastroenterology

## 2018-01-13 ENCOUNTER — Other Ambulatory Visit: Payer: Self-pay

## 2018-01-21 ENCOUNTER — Other Ambulatory Visit: Payer: Self-pay | Admitting: Nurse Practitioner

## 2018-01-21 DIAGNOSIS — Z1231 Encounter for screening mammogram for malignant neoplasm of breast: Secondary | ICD-10-CM

## 2018-02-11 ENCOUNTER — Ambulatory Visit
Admission: RE | Admit: 2018-02-11 | Discharge: 2018-02-11 | Disposition: A | Discharge status: Home / Self Care | Payer: 59 | Source: Ambulatory Visit | Attending: Nurse Practitioner | Admitting: Nurse Practitioner

## 2018-02-11 DIAGNOSIS — Z1231 Encounter for screening mammogram for malignant neoplasm of breast: Secondary | ICD-10-CM | POA: Diagnosis not present

## 2019-01-15 ENCOUNTER — Other Ambulatory Visit: Payer: Self-pay

## 2019-01-15 ENCOUNTER — Emergency Department
Admission: EM | Admit: 2019-01-15 | Discharge: 2019-01-15 | Disposition: A | Discharge status: Home / Self Care | Payer: BLUE CROSS/BLUE SHIELD | Attending: Emergency Medicine | Admitting: Emergency Medicine

## 2019-01-15 DIAGNOSIS — Z79899 Other long term (current) drug therapy: Secondary | ICD-10-CM | POA: Diagnosis not present

## 2019-01-15 DIAGNOSIS — F17210 Nicotine dependence, cigarettes, uncomplicated: Secondary | ICD-10-CM | POA: Diagnosis not present

## 2019-01-15 DIAGNOSIS — R21 Rash and other nonspecific skin eruption: Secondary | ICD-10-CM | POA: Diagnosis present

## 2019-01-15 DIAGNOSIS — I5032 Chronic diastolic (congestive) heart failure: Secondary | ICD-10-CM | POA: Diagnosis not present

## 2019-01-15 DIAGNOSIS — Z7982 Long term (current) use of aspirin: Secondary | ICD-10-CM | POA: Insufficient documentation

## 2019-01-15 DIAGNOSIS — L301 Dyshidrosis [pompholyx]: Secondary | ICD-10-CM | POA: Insufficient documentation

## 2019-01-15 MED ORDER — HYDROCORTISONE 1 % EX CREA: 1.0000 "application " | TOPICAL_CREAM | Freq: Four times a day (QID) | CUTANEOUS | 2 refills | Status: DC

## 2019-01-15 NOTE — ED Provider Notes (Signed)
[facility] Emergency Department Provider Note  ____________________________________________  Time seen: Approximately 8:16 PM  I have reviewed the triage vital signs and the nursing notes.   HISTORY  Chief Complaint Rash    HPI Sarah Mack is a 63 y.o. female who presents the emergency department complaining of pruritic as well as slightly painful rash to bilateral hands.  Patient reports that she has been dealing with this condition for "years."  Patient is having another "outbreak."  Patient is unsure of a diagnosis for her hands but states that she has been treated with topical steroids for years.  States that the triamcinolone that she uses "burns" the cracked skin and is requesting a less potent medication.  No other complaints at this time.         Past Medical History:  Diagnosis Date  . CHF (congestive heart failure) (HCC)   . GERD (gastroesophageal reflux disease)   . Leaky heart valve     Patient Active Problem List   Diagnosis Date Noted  . History of colonic polyps   . Hypotension 12/19/2015  . Chronic diastolic heart failure (HCC) 11/17/2015  . Tobacco use 11/17/2015  . Aortic insufficiency 11/17/2015  . Tachycardia 11/17/2015    Past Surgical History:  Procedure Laterality Date  . COLONOSCOPY WITH PROPOFOL N/A 08/06/2017   Procedure: COLONOSCOPY WITH PROPOFOL;  Surgeon: Midge MiniumWohl, Darren, MD;  [facility];  Service: Endoscopy;  Laterality: N/A;  . NO PAST SURGERIES      Prior to Admission medications   Medication Sig Start Date End Date Taking? Authorizing Provider  ALPRAZolam (XANAX) 0.25 MG tablet Take 1 tablet (0.25 mg total) by mouth at bedtime as needed for anxiety. 06/04/17   Sherrie MustacheFisher, Roselyn BeringSusan W, PA-C  aspirin 81 MG chewable tablet Chew 81 mg by mouth daily.    [provider]  citalopram (CELEXA) 20 MG tablet Take 1 tablet (20 mg total) by mouth daily. 06/04/17   Fisher, Roselyn BeringSusan W, PA-C  hydrocortisone cream 1  % Apply 1 application topically 4 (four) times daily. 01/15/19   Cuthriell, Delorise RoyalsJonathan D, PA-C  montelukast (SINGULAIR) 10 MG tablet Take 10 mg by mouth daily.    [provider]  mupirocin ointment (BACTROBAN) 2 % Apply 1 application topically 3 (three) times daily.    [provider]  ramipril (ALTACE) 5 MG capsule Take 5 mg by mouth daily.    [provider]  VOLTAREN 1 % GEL Apply 2 g topically every 8 (eight) hours as needed (for knee pain).     [provider]    Allergies Patient has no known allergies.  Family History  Problem Relation Age of Onset  . Liver cancer Mother   . Heart failure Father   . Diabetes Father   . Hypertension Sister   . Hypertension Brother   . Leukemia Sister   . Hypertension Brother   . Alcoholism Brother   . Hypertension Brother     Social History Social History   Tobacco Use  . Smoking status: Current Every Day Smoker    Packs/day: 0.50    Years: 10.00    Pack years: 5.00    Types: Cigarettes  . Smokeless tobacco: Never Used  Substance Use Topics  . Alcohol use: No  . Drug use: No     Review of Systems  Constitutional: No fever/chills Eyes: No visual changes. No discharge ENT: No upper respiratory complaints. Cardiovascular: no chest pain. Respiratory: no cough. No SOB. Gastrointestinal:  No abdominal pain.  No nausea, no vomiting.  No diarrhea.  No constipation. Musculoskeletal: Negative for musculoskeletal pain. Skin: Positive for rash to bilateral hands with cracked skin, pruritus Neurological: Negative for headaches, focal weakness or numbness. 10-point ROS otherwise negative.  ____________________________________________   PHYSICAL EXAM:  VITAL SIGNS: ED Triage Vitals [01/15/19 2002]  Enc Vitals Group     BP 137/69     Pulse Rate 68     Resp 20     Temp 98.5 F (36.9 C)     Temp src      SpO2 97 %     Weight 222 lb (100.7 kg)     Height 4\' 11"  (1.499 m)     Head Circumference       Peak Flow      Pain Score 0     Pain Loc      Pain Edu?      Excl. in [place]?      Constitutional: Alert and oriented. Well appearing and in no acute distress. Eyes: Conjunctivae are normal. PERRL. EOMI. Head: Atraumatic. ENT:      Ears:       Nose: No congestion/rhinnorhea.      Mouth/Throat: Mucous membranes are moist.  Neck: No stridor.    Cardiovascular: Normal rate, regular rhythm. Normal S1 and S2.  Good peripheral circulation. Respiratory: Normal respiratory effort without tachypnea or retractions. Lungs CTAB. Good air entry to the bases with no decreased or absent breath sounds. Musculoskeletal: Full range of motion to all extremities. No gross deformities appreciated. Neurologic:  Normal speech and language. No gross focal neurologic deficits are appreciated.  Skin:  Skin is warm, dry and intact.  Visualization of bilateral hands reveals papules, small vesicle, cracked skin.  Patient has diffuse findings bilaterally of dyshidrotic eczema.  No signs of overlying cellulitis. Psychiatric: Mood and affect are normal. Speech and behavior are normal. Patient exhibits appropriate insight and judgement.   ____________________________________________   LABS (all labs ordered are listed, but only abnormal results are displayed)  Labs Reviewed - No data to display ____________________________________________  EKG   ____________________________________________  RADIOLOGY   No results found.  ____________________________________________    PROCEDURES  Procedure(s) performed:    Procedures    Medications - No data to display   ____________________________________________   INITIAL IMPRESSION / ASSESSMENT AND PLAN / ED COURSE  Pertinent labs & imaging results that were available during my care of the patient were reviewed by me and considered in my medical decision making (see chart for details).  Review of the [place] CSRS was performed in accordance of the  [place] prior to dispensing any controlled drugs.           Patient's diagnosis is consistent with dyshidrotic eczema.  Patient presented to emergency department complaining of itching, skin breakout to both hands.  This is occurred and reoccurred for many years for the patient.  Patient typically uses triamcinolone topical for symptoms.  She reports that the triamcinolone "burns."  Findings are consistent with dyshidrotic eczema with no evidence of overlying cellulitis..  Patient is encouraged to use good moisturizing lotions on her hands.  Patient will be prescribed hydrocortisone and advised to follow-up with primary care.  Patient is given ED precautions to return to the ED for any worsening or new symptoms.     ____________________________________________  FINAL CLINICAL IMPRESSION(S) / ED DIAGNOSES  Final diagnoses:  Dyshidrotic eczema      NEW MEDICATIONS STARTED DURING THIS VISIT:  ED Discharge Orders         Ordered    hydrocortisone cream 1 %  4 times daily     01/15/19 2045              This chart was dictated using voice recognition software/Dragon. Despite best efforts to proofread, errors can occur which can change the meaning. Any change was purely unintentional.    Racheal Patches, PA-C 01/15/19 2052    Phineas Semen, MD 01/15/19 2141

## 2019-01-15 NOTE — ED Notes (Signed)
Pt has itching and burning rash to both hands for 1 week.  Pt was using a cream without relief.

## 2019-01-15 NOTE — ED Triage Notes (Signed)
Pt in with co dry skin to hands states has had it for years but unsure of cause.

## 2019-01-15 NOTE — Discharge Instructions (Signed)
Please use a good moisturizing lotion to both hands.  Using something unscented products similar to Aveeno moisturizers should result in some improvement of symptoms.  You may use the prescription for itching relief as well as improvement of the rash.  I would also recommend an appointment found in some stores or on [place] called honey cream that will further moisturize and help the cracked skin.

## 2019-01-28 ENCOUNTER — Other Ambulatory Visit: Payer: Self-pay | Admitting: Nurse Practitioner

## 2019-01-28 DIAGNOSIS — Z1231 Encounter for screening mammogram for malignant neoplasm of breast: Secondary | ICD-10-CM

## 2019-03-05 ENCOUNTER — Other Ambulatory Visit: Payer: Self-pay

## 2019-03-05 ENCOUNTER — Other Ambulatory Visit
Admission: RE | Admit: 2019-03-05 | Discharge: 2019-03-05 | Disposition: A | Discharge status: Home / Self Care | Payer: BLUE CROSS/BLUE SHIELD | Source: Ambulatory Visit | Attending: Internal Medicine | Admitting: Internal Medicine

## 2019-03-05 DIAGNOSIS — Z20828 Contact with and (suspected) exposure to other viral communicable diseases: Secondary | ICD-10-CM | POA: Insufficient documentation

## 2019-03-05 DIAGNOSIS — Z01812 Encounter for preprocedural laboratory examination: Secondary | ICD-10-CM | POA: Diagnosis present

## 2019-03-05 LAB — SARS CORONAVIRUS 2 (TAT 6-24 HRS): SARS Coronavirus 2: NEGATIVE

## 2019-03-09 ENCOUNTER — Ambulatory Visit: Payer: BLUE CROSS/BLUE SHIELD | Admitting: Anesthesiology

## 2019-03-09 ENCOUNTER — Encounter: Payer: Self-pay | Admitting: Internal Medicine

## 2019-03-09 ENCOUNTER — Other Ambulatory Visit: Payer: Self-pay

## 2019-03-09 ENCOUNTER — Encounter
Admission: RE | Disposition: A | Discharge status: Home / Self Care | Payer: Self-pay | Source: Home / Self Care | Attending: Internal Medicine

## 2019-03-09 ENCOUNTER — Ambulatory Visit
Admission: RE | Admit: 2019-03-09 | Discharge: 2019-03-09 | Disposition: A | Discharge status: Home / Self Care | Payer: BLUE CROSS/BLUE SHIELD | Attending: Internal Medicine | Admitting: Internal Medicine

## 2019-03-09 DIAGNOSIS — Z86010 Personal history of colonic polyps: Secondary | ICD-10-CM | POA: Insufficient documentation

## 2019-03-09 DIAGNOSIS — Z79899 Other long term (current) drug therapy: Secondary | ICD-10-CM | POA: Insufficient documentation

## 2019-03-09 DIAGNOSIS — I5032 Chronic diastolic (congestive) heart failure: Secondary | ICD-10-CM | POA: Insufficient documentation

## 2019-03-09 DIAGNOSIS — F17210 Nicotine dependence, cigarettes, uncomplicated: Secondary | ICD-10-CM | POA: Diagnosis not present

## 2019-03-09 DIAGNOSIS — Z7982 Long term (current) use of aspirin: Secondary | ICD-10-CM | POA: Diagnosis not present

## 2019-03-09 DIAGNOSIS — Z1211 Encounter for screening for malignant neoplasm of colon: Secondary | ICD-10-CM | POA: Insufficient documentation

## 2019-03-09 DIAGNOSIS — Z6841 Body Mass Index (BMI) 40.0 and over, adult: Secondary | ICD-10-CM | POA: Insufficient documentation

## 2019-03-09 DIAGNOSIS — I110 Hypertensive heart disease with heart failure: Secondary | ICD-10-CM | POA: Insufficient documentation

## 2019-03-09 HISTORY — PX: COLONOSCOPY WITH PROPOFOL: SHX5780

## 2019-03-09 HISTORY — DX: Essential (primary) hypertension: I10

## 2019-03-09 SURGERY — COLONOSCOPY WITH PROPOFOL
Anesthesia: General

## 2019-03-09 MED ORDER — SODIUM CHLORIDE 0.9 % IV SOLN
INTRAVENOUS | Status: DC
  Administered 2019-03-09: 1000 mL via INTRAVENOUS

## 2019-03-09 MED ORDER — PROPOFOL 10 MG/ML IV BOLUS
INTRAVENOUS | Status: AC
  Filled 2019-03-09: qty 20

## 2019-03-09 MED ORDER — PROPOFOL 500 MG/50ML IV EMUL
INTRAVENOUS | Status: DC | PRN
  Administered 2019-03-09: 120 ug/kg/min via INTRAVENOUS

## 2019-03-09 MED ORDER — PROPOFOL 10 MG/ML IV BOLUS
INTRAVENOUS | Status: DC | PRN
  Administered 2019-03-09: 50 mg via INTRAVENOUS
  Administered 2019-03-09: 20 mg via INTRAVENOUS

## 2019-03-09 MED ORDER — LIDOCAINE HCL (CARDIAC) PF 100 MG/5ML IV SOSY
PREFILLED_SYRINGE | INTRAVENOUS | Status: DC | PRN
  Administered 2019-03-09: 60 mg via INTRAVENOUS

## 2019-03-09 MED ORDER — LIDOCAINE HCL (PF) 2 % IJ SOLN
INTRAMUSCULAR | Status: AC
  Filled 2019-03-09: qty 10

## 2019-03-09 NOTE — H&P (Signed)
  Outpatient short stay form Pre-procedure 03/09/2019 10:02 AM Keirstan Iannello K. Alice Reichert, M.D.  Primary Physician: Lamonte Sakai, M.D.  Reason for visit:  Personal hx of adenomatous colon polyp (2013)  History of present illness:                            Patient presents for colonoscopy for a personal hx of colon polyps. The patient denies abdominal pain, abnormal weight loss or rectal bleeding.     Current Facility-Administered Medications:  .  0.9 %  sodium chloride infusion, , Intravenous, Continuous, [place], Benay Pike, MD, Last Rate: 20 mL/hr at 03/09/19 0903, 1,000 mL at 03/09/19 6283  Medications Prior to Admission  Medication Sig Dispense Refill Last Dose  . rosuvastatin (CRESTOR) 20 MG tablet Take 20 mg by mouth daily.     Marland Kitchen ALPRAZolam (XANAX) 0.25 MG tablet Take 1 tablet (0.25 mg total) by mouth at bedtime as needed for anxiety. 15 tablet 0   . aspirin 81 MG chewable tablet Chew 81 mg by mouth daily.     . hydrocortisone cream 1 % Apply 1 application topically 4 (four) times daily. 30 g 2   . montelukast (SINGULAIR) 10 MG tablet Take 10 mg by mouth daily.     . mupirocin ointment (BACTROBAN) 2 % Apply 1 application topically 3 (three) times daily.     . ramipril (ALTACE) 5 MG capsule Take 5 mg by mouth daily.     . VOLTAREN 1 % GEL Apply 2 g topically every 8 (eight) hours as needed (for knee pain).         No Known Allergies   Past Medical History:  Diagnosis Date  . CHF (congestive heart failure) ([place])   . GERD (gastroesophageal reflux disease)   . Hypertension   . Leaky heart valve     Review of systems:  Otherwise negative.    Physical Exam  Gen: Alert, oriented. Appears stated age.  HEENT: [place]/AT. PERRLA. Lungs: CTA, no wheezes. CV: RR nl S1, S2. Abd: soft, benign, no masses. BS+ Ext: No edema. Pulses 2+    Planned procedures: Proceed with colonoscopy. The patient understands the nature of the planned procedure, indications, risks, alternatives and potential  complications including but not limited to bleeding, infection, perforation, damage to internal organs and possible oversedation/side effects from anesthesia. The patient agrees and gives consent to proceed.  Please refer to procedure notes for findings, recommendations and patient disposition/instructions.     Keen Ewalt K. Alice Reichert, M.D. Gastroenterology 03/09/2019  10:02 AM

## 2019-03-09 NOTE — Interval H&P Note (Signed)
History and Physical Interval Note:  03/09/2019 10:03 AM  Sarah Mack  has presented today for surgery, with the diagnosis of PERSONAL HX.OF COLON POLYPD.  The various methods of treatment have been discussed with the patient and family. After consideration of risks, benefits and other options for treatment, the patient has consented to  Procedure(s): COLONOSCOPY WITH PROPOFOL (N/A) as a surgical intervention.  The patient's history has been reviewed, patient examined, no change in status, stable for surgery.  I have reviewed the patient's chart and labs.  Questions were answered to the patient's satisfaction.     [place], [place]

## 2019-03-09 NOTE — Anesthesia Preprocedure Evaluation (Addendum)
Anesthesia Evaluation  Patient identified by MRN, date of birth, ID band Patient awake    Reviewed: Allergy & Precautions, H&P , NPO status , Patient's Chart, lab work & pertinent test results  Airway Mallampati: II  TM Distance: >3 FB     Dental  (+) Edentulous Lower, Edentulous Upper   Pulmonary Current Smoker,           Cardiovascular hypertension, +CHF (HFpEF)  + Valvular Problems/Murmurs AI      Neuro/Psych negative neurological ROS  negative psych ROS   GI/Hepatic Neg liver ROS, GERD  Controlled,  Endo/Other  Morbid obesity (BMI 44)  Renal/GU negative Renal ROS  negative genitourinary   Musculoskeletal   Abdominal   Peds  Hematology negative hematology ROS (+)   Anesthesia Other Findings Past Medical History: No date: CHF (congestive heart failure) (HCC) No date: GERD (gastroesophageal reflux disease) No date: Hypertension No date: Leaky heart valve  Past Surgical History: 08/06/2017: COLONOSCOPY WITH PROPOFOL; N/A     Comment:  Procedure: COLONOSCOPY WITH PROPOFOL;  Surgeon: Lucilla Lame, MD;  Location: [facility] ENDOSCOPY;  Service:               Endoscopy;  Laterality: N/A; No date: NO PAST SURGERIES  BMI    Body Mass Index: 43.81 kg/m      Reproductive/Obstetrics negative OB ROS                            Anesthesia Physical Anesthesia Plan  ASA: II  Anesthesia Plan: General   Post-op Pain Management:    Induction:   PONV Risk Score and Plan: Propofol infusion and TIVA  Airway Management Planned: Natural Airway and Nasal Cannula  Additional Equipment:   Intra-op Plan:   Post-operative Plan:   Informed Consent: I have reviewed the patients History and Physical, chart, labs and discussed the procedure including the risks, benefits and alternatives for the proposed anesthesia with the patient or authorized representative who has indicated his/her  understanding and acceptance.     Dental Advisory Given  Plan Discussed with: Anesthesiologist  Anesthesia Plan Comments:         Anesthesia Quick Evaluation

## 2019-03-09 NOTE — Anesthesia Post-op Follow-up Note (Signed)
Anesthesia QCDR form completed.        

## 2019-03-09 NOTE — Transfer of Care (Signed)
Immediate Anesthesia Transfer of Care Note  Patient: Sarah Mack  Procedure(s) Performed: COLONOSCOPY WITH PROPOFOL (N/A )  Patient Location: PACU  Anesthesia Type:General  Level of Consciousness: sedated  Airway & Oxygen Therapy: Patient Spontanous Breathing and Patient connected to nasal cannula oxygen  Post-op Assessment: Report given to RN and Post -op Vital signs reviewed and stable  Post vital signs: Reviewed and stable  Last Vitals:  Vitals Value Taken Time  BP 99/69 03/09/19 1029  Temp 35.7 C 03/09/19 1028  Pulse 91 03/09/19 1030  Resp 21 03/09/19 1030  SpO2 100 % 03/09/19 1030  Vitals shown include unvalidated device data.  Last Pain:  Vitals:   03/09/19 1028  TempSrc: Temporal  PainSc: 0-No pain         Complications: No apparent anesthesia complications

## 2019-03-09 NOTE — Op Note (Signed)
[facility] Patient Name: Sarah Mack Procedure Date: 03/09/2019 9:57 AM MRN: [id number] Account #: [id number] Date of Birth: 11/08/1955 Admit Type: Outpatient Age: 63 Room: [facility] ENDO ROOM 1 Gender: Female Note Status: Finalized Procedure:             Colonoscopy Indications:           Surveillance: Personal history of adenomatous polyps                         on last colonoscopy > 5 years ago Providers:             Lorie Apley K. Alice Reichert MD, MD Referring MD:          Perrin Maltese, MD (Referring MD) Medicines:             Propofol per Anesthesia Complications:         No immediate complications. Procedure:             Pre-Anesthesia Assessment:                        - The risks and benefits of the procedure and the                         sedation options and risks were discussed with the                         patient. All questions were answered and informed                         consent was obtained.                        - Patient identification and proposed procedure were                         verified prior to the procedure by the nurse.                        - ASA Grade Assessment: III - A patient with severe                         systemic disease.                        - After reviewing the risks and benefits, the patient                         was deemed in satisfactory condition to undergo the                         procedure.                        After obtaining informed consent, the colonoscope was                         passed under direct vision. Throughout the procedure,                         the patient's blood pressure,  pulse, and oxygen                         saturations were monitored continuously. The                         Colonoscope was introduced through the anus and                         advanced to the the cecum, identified by appendiceal                         orifice and ileocecal valve. The  colonoscopy was                         performed without difficulty. The patient tolerated                         the procedure well. The quality of the bowel                         preparation was excellent. The ileocecal valve,                         appendiceal orifice, and rectum were photographed. Findings:      The perianal and digital rectal examinations were normal. Pertinent       negatives include normal sphincter tone and no palpable rectal lesions.      The entire examined colon appeared normal on direct and retroflexion       views. Impression:            - The entire examined colon is normal on direct and                         retroflexion views.                        - No specimens collected. Recommendation:        - Patient has a contact number available for                         emergencies. The signs and symptoms of potential                         delayed complications were discussed with the patient.                         Return to normal activities tomorrow. Written                         discharge instructions were provided to the patient.                        - Resume previous diet.                        - Continue present medications.                        - Repeat colonoscopy in 10 years for  screening                         purposes.                        - Return to GI clinic PRN.                        - The findings and recommendations were discussed with                         the patient. Procedure Code(s):     --- Professional ---                        [id number], Colorectal cancer screening; colonoscopy on                         individual at high risk Diagnosis Code(s):     --- Professional ---                        Z86.010, Personal history of colonic polyps CPT copyright 2019 [employer]. All rights reserved. The codes documented in this report are preliminary and upon coder review may  be revised to meet current  compliance requirements. Stanton Kidneyeodoro K Maveryck Bahri MD, MD 03/09/2019 10:24:26 AM This report has been signed electronically. Number of Addenda: 0 Note Initiated On: 03/09/2019 9:57 AM Scope Withdrawal Time: 0 hours 6 minutes 6 seconds  Total Procedure Duration: 0 hours 8 minutes 59 seconds  Estimated Blood Loss:  Estimated blood loss: none.      [facility]

## 2019-03-09 NOTE — Anesthesia Postprocedure Evaluation (Signed)
Anesthesia Post Note  Patient: Sarah Mack  Procedure(s) Performed: COLONOSCOPY WITH PROPOFOL (N/A )  Patient location during evaluation: PACU Anesthesia Type: General Level of consciousness: awake and alert Pain management: pain level controlled Vital Signs Assessment: post-procedure vital signs reviewed and stable Respiratory status: spontaneous breathing, nonlabored ventilation and respiratory function stable Cardiovascular status: blood pressure returned to baseline and stable Postop Assessment: no apparent nausea or vomiting Anesthetic complications: no     Last Vitals:  Vitals:   03/09/19 1028 03/09/19 1038  BP: 99/69 (!) 86/63  Pulse: 91   Resp: 17   Temp: (!) 35.7 C   SpO2: 100%     Last Pain:  Vitals:   03/09/19 1048  TempSrc:   PainSc: 0-No pain                 Durenda Hurt

## 2019-03-10 ENCOUNTER — Encounter: Payer: Self-pay | Admitting: *Deleted

## 2019-04-14 ENCOUNTER — Ambulatory Visit
Admission: RE | Admit: 2019-04-14 | Discharge: 2019-04-14 | Disposition: A | Discharge status: Home / Self Care | Payer: 59 | Source: Ambulatory Visit | Attending: Nurse Practitioner | Admitting: Nurse Practitioner

## 2019-04-14 DIAGNOSIS — Z1231 Encounter for screening mammogram for malignant neoplasm of breast: Secondary | ICD-10-CM | POA: Diagnosis not present

## 2019-08-24 ENCOUNTER — Other Ambulatory Visit: Payer: Self-pay

## 2019-08-24 ENCOUNTER — Encounter: Payer: Self-pay | Admitting: Emergency Medicine

## 2019-08-24 ENCOUNTER — Emergency Department
Admission: EM | Admit: 2019-08-24 | Discharge: 2019-08-24 | Disposition: A | Discharge status: Home / Self Care | Payer: 59 | Attending: Emergency Medicine | Admitting: Emergency Medicine

## 2019-08-24 DIAGNOSIS — I5032 Chronic diastolic (congestive) heart failure: Secondary | ICD-10-CM | POA: Diagnosis not present

## 2019-08-24 DIAGNOSIS — R42 Dizziness and giddiness: Secondary | ICD-10-CM | POA: Insufficient documentation

## 2019-08-24 DIAGNOSIS — Z7982 Long term (current) use of aspirin: Secondary | ICD-10-CM | POA: Insufficient documentation

## 2019-08-24 DIAGNOSIS — R251 Tremor, unspecified: Secondary | ICD-10-CM | POA: Insufficient documentation

## 2019-08-24 DIAGNOSIS — F17210 Nicotine dependence, cigarettes, uncomplicated: Secondary | ICD-10-CM | POA: Insufficient documentation

## 2019-08-24 DIAGNOSIS — Z79899 Other long term (current) drug therapy: Secondary | ICD-10-CM | POA: Insufficient documentation

## 2019-08-24 DIAGNOSIS — I110 Hypertensive heart disease with heart failure: Secondary | ICD-10-CM | POA: Diagnosis not present

## 2019-08-24 LAB — BASIC METABOLIC PANEL
Anion gap: 9 (ref 5–15)
BUN: 18 mg/dL (ref 8–23)
CO2: 24 mmol/L (ref 22–32)
Calcium: 8.7 mg/dL — ABNORMAL LOW (ref 8.9–10.3)
Chloride: 107 mmol/L (ref 98–111)
Creatinine, Ser: 1.09 mg/dL — ABNORMAL HIGH (ref 0.44–1.00)
GFR calc Af Amer: >60 mL/min (ref >60)
GFR calc non Af Amer: 54 mL/min — ABNORMAL LOW (ref >60)
Glucose, Bld: 107 mg/dL — ABNORMAL HIGH (ref 70–99)
Potassium: 3.9 mmol/L (ref 3.5–5.1)
Sodium: 140 mmol/L (ref 135–145)

## 2019-08-24 LAB — CBC
HCT: 42.4 % (ref 36.0–46.0)
Hemoglobin: 14.1 g/dL (ref 12.0–15.0)
MCH: 29.7 pg (ref 26.0–34.0)
MCHC: 33.3 g/dL (ref 30.0–36.0)
MCV: 89.3 fL (ref 80.0–100.0)
Platelets: 194 10*3/uL (ref 150–400)
RBC: 4.75 MIL/uL (ref 3.87–5.11)
RDW: 14.8 % (ref 11.5–15.5)
WBC: 8.9 10*3/uL (ref 4.0–10.5)
nRBC: 0 % (ref 0.0–0.2)

## 2019-08-24 LAB — TROPONIN I (HIGH SENSITIVITY): Troponin I (High Sensitivity): 3 ng/L (ref <18)

## 2019-08-24 MED ORDER — MECLIZINE HCL 25 MG PO TABS
25.0000 mg | ORAL_TABLET | Freq: Three times a day (TID) | ORAL | 0 refills | Status: DC | PRN
Start: 2019-08-24 — End: 2020-08-12

## 2019-08-24 NOTE — ED Triage Notes (Signed)
Pt comes into the ED via EMS from home with c/o seizure like activity per family, EMS witnessed tremors to the upper extremities and head, a/ox4, talked through the episdoe. VSS, denies any pain, in nAD.

## 2019-08-24 NOTE — Discharge Instructions (Addendum)
Please seek medical attention for any high fevers, chest pain, shortness of breath, change in behavior, persistent vomiting, bloody stool or any other new or concerning symptoms.  

## 2019-08-24 NOTE — ED Provider Notes (Signed)
[facility] Emergency Department Provider Note   ____________________________________________   I have reviewed the triage vital signs and the nursing notes.   HISTORY  Chief Complaint Dizziness   History limited by: Not Limited   HPI Sarah Mack is a 64 y.o. female who presents to the emergency department today after an episode of dizziness and some shaking.  Patient states that when she was sitting down when all of a sudden she felt a pop in her left ear.  She immediately had onset of dizziness.  This then led to some shaking of her upper extremities.  Patient states she was completely awake and conscious during this whole episode.  Episode last about 5 minutes.  By the time my exam the patient does not have any further dizziness.  She says she had a similar episode once in the past.  She denies any recent illness.  Denies any head trauma.   Records reviewed. Per medical record review patient has a history of CHF, HTN.  Past Medical History:  Diagnosis Date  . CHF (congestive heart failure) (HCC)   . GERD (gastroesophageal reflux disease)   . Hypertension   . Leaky heart valve     Patient Active Problem List   Diagnosis Date Noted  . History of colonic polyps   . Hypotension 12/19/2015  . Chronic diastolic heart failure (HCC) 11/17/2015  . Tobacco use 11/17/2015  . Aortic insufficiency 11/17/2015  . Tachycardia 11/17/2015    Past Surgical History:  Procedure Laterality Date  . COLONOSCOPY WITH PROPOFOL N/A 08/06/2017   Procedure: COLONOSCOPY WITH PROPOFOL;  Surgeon: Midge Minium, MD;  Location: [facility] ENDOSCOPY;  Service: Endoscopy;  Laterality: N/A;  . COLONOSCOPY WITH PROPOFOL N/A 03/09/2019   Procedure: COLONOSCOPY WITH PROPOFOL;  Surgeon: Toledo, Boykin Nearing, MD;  Location: [facility] ENDOSCOPY;  Service: Gastroenterology;  Laterality: N/A;  . NO PAST SURGERIES      Prior to Admission medications   Medication Sig Start Date End Date Taking?  Authorizing Provider  ALPRAZolam (XANAX) 0.25 MG tablet Take 1 tablet (0.25 mg total) by mouth at bedtime as needed for anxiety. 06/04/17   Sherrie Mustache Roselyn Bering, PA-C  aspirin 81 MG chewable tablet Chew 81 mg by mouth daily.    [provider]  hydrocortisone cream 1 % Apply 1 application topically 4 (four) times daily. 01/15/19   Cuthriell, Delorise Royals, PA-C  montelukast (SINGULAIR) 10 MG tablet Take 10 mg by mouth daily.    [provider]  mupirocin ointment (BACTROBAN) 2 % Apply 1 application topically 3 (three) times daily.    [provider]  ramipril (ALTACE) 5 MG capsule Take 5 mg by mouth daily.    [provider]  rosuvastatin (CRESTOR) 20 MG tablet Take 20 mg by mouth daily.    [provider]  VOLTAREN 1 % GEL Apply 2 g topically every 8 (eight) hours as needed (for knee pain).     [provider]    Allergies Patient has no known allergies.  Family History  Problem Relation Age of Onset  . Liver cancer Mother   . Heart failure Father   . Diabetes Father   . Hypertension Sister   . Hypertension Brother   . Leukemia Sister   . Hypertension Brother   . Alcoholism Brother   . Hypertension Brother   . Breast cancer Neg Hx     Social History Social History   Tobacco Use  . Smoking status: Current Every Day Smoker  Packs/day: 0.50    Years: 10.00    Pack years: 5.00    Types: Cigarettes  . Smokeless tobacco: Never Used  Substance Use Topics  . Alcohol use: No  . Drug use: No    Review of Systems Constitutional: No fever/chills Eyes: No visual changes. ENT: Positive for ear popping of left ear Cardiovascular: Denies chest pain. Respiratory: Denies shortness of breath. Gastrointestinal: No abdominal pain.  No nausea, no vomiting.  No diarrhea.   Genitourinary: Negative for dysuria. Musculoskeletal: Negative for back pain. Skin: Negative for rash. Neurological: Positive for  dizziness. ____________________________________________   PHYSICAL EXAM:  VITAL SIGNS: ED Triage Vitals [08/24/19 1615]  Enc Vitals Group     BP (!) 129/57     Pulse Rate 74     Resp 18     Temp 98.3 F (36.8 C)     Temp Source Oral     SpO2 98 %     Weight 202 lb (91.6 kg)     Height 5' (1.524 m)     Head Circumference      Peak Flow      Pain Score 0   Constitutional: Alert and oriented.  Eyes: Conjunctivae are normal.  ENT      Head: Normocephalic and atraumatic.      Ears: Bilateral TMs wnl      Nose: No congestion/rhinnorhea.      Mouth/Throat: Mucous membranes are moist.      Neck: No stridor. Hematological/Lymphatic/Immunilogical: No cervical lymphadenopathy. Cardiovascular: Normal rate, regular rhythm.  No murmurs, rubs, or gallops.  Respiratory: Normal respiratory effort without tachypnea nor retractions. Breath sounds are clear and equal bilaterally. No wheezes/rales/rhonchi. Gastrointestinal: Soft and non tender. No rebound. No guarding.  Genitourinary: Deferred Musculoskeletal: Normal range of motion in all extremities. No lower extremity edema. Neurologic:  Normal speech and language. PERRL. EOMI. Strength 5/5 in upper and lower extremities. Sensation intact. No gross focal neurologic deficits are appreciated.  Skin:  Skin is warm, dry and intact. No rash noted. Psychiatric: Mood and affect are normal. Speech and behavior are normal. Patient exhibits appropriate insight and judgment.  ____________________________________________    LABS (pertinent positives/negatives)  CBC wbc 8.9, hgb 14.1, plt 194 Trop hs 3 BMP na 140, k 3.9, glu 107, cr 1.09  ____________________________________________   EKG  I, Nance Pear, attending physician, personally viewed and interpreted this EKG  EKG Time: 1642 Rate: 74 Rhythm: sinus rhythm Axis: normal Intervals: qtc 475 QRS: narrow ST changes: no st elevation Impression: normal  ekg   ____________________________________________    RADIOLOGY  None  ____________________________________________   PROCEDURES  Procedures  ____________________________________________   INITIAL IMPRESSION / ASSESSMENT AND PLAN / ED COURSE  Pertinent labs & imaging results that were available during my care of the patient were reviewed by me and considered in my medical decision making (see chart for details).   Patient presented to the emergency department today because of concerns for episode of dizziness with some shaking.  Patient describes a popping sensation in her ear.  Denies any change in her hearing although the dizziness happened immediately afterwards.  On my exam I do not appreciate any tympanic perforation.  I do wonder if the patient is suffering from middle ear disorder given clinical history.  At the time my exam however patient states she feels completely back to normal.  At this point I doubt central lesion.  Given that she feels better I do think is reasonable for patient be discharged  home.  Will give patient prescription for meclizine to help with symptoms should they return. Discussed return precautions. ____________________________________________   FINAL CLINICAL IMPRESSION(S) / ED DIAGNOSES  Final diagnoses:  Dizziness  Vertigo     Note: This dictation was prepared with Dragon dictation. Any transcriptional errors that result from this process are unintentional     Phineas Semen, MD 08/24/19 2222

## 2019-08-24 NOTE — ED Triage Notes (Signed)
Pt was at home and felt ear pop then room started spinning and pt began jerking. Pt remembers whole event. Has happened in past.  Dizziness has stopped at this time.  Pt c/o feeling weak at this time but denies other symptoms.  Alert and oriented. NAD

## 2019-08-24 NOTE — ED Notes (Signed)
Pt reports no changes to speech or vision, no unilateral weakness

## 2019-08-24 NOTE — ED Notes (Signed)
Pt reports this has happened once before two years ago, and was told it was either vertigo or a panic attack. Pt reports she was given a prescription for xanax, which she says did not help her  Pt reports she felt her left ear pop and felt some pain, then felt dizzy/room spinning Reports episode lasted approx 5 min. No LOC or change in orientation. No history of seizures. No N/V  Reports feeling occasionally lightheaded or off balance

## 2019-10-21 ENCOUNTER — Other Ambulatory Visit: Payer: Self-pay | Admitting: Unknown Physician Specialty

## 2019-10-21 DIAGNOSIS — R42 Dizziness and giddiness: Secondary | ICD-10-CM

## 2019-11-13 ENCOUNTER — Ambulatory Visit
Admission: RE | Admit: 2019-11-13 | Discharge: 2019-11-13 | Disposition: A | Discharge status: Home / Self Care | Payer: 59 | Source: Ambulatory Visit | Attending: Unknown Physician Specialty | Admitting: Unknown Physician Specialty

## 2019-11-13 ENCOUNTER — Other Ambulatory Visit: Payer: Self-pay

## 2019-11-13 DIAGNOSIS — R42 Dizziness and giddiness: Secondary | ICD-10-CM | POA: Diagnosis not present

## 2019-11-13 MED ORDER — GADOBUTROL 1 MMOL/ML IV SOLN
10.0000 mL | Freq: Once | INTRAVENOUS | Status: AC | PRN
  Administered 2019-11-13: 10 mL via INTRAVENOUS

## 2020-05-27 ENCOUNTER — Other Ambulatory Visit: Payer: Self-pay | Admitting: Nurse Practitioner

## 2020-05-27 DIAGNOSIS — Z1231 Encounter for screening mammogram for malignant neoplasm of breast: Secondary | ICD-10-CM

## 2020-06-24 ENCOUNTER — Ambulatory Visit
Admission: RE | Admit: 2020-06-24 | Discharge: 2020-06-24 | Disposition: A | Discharge status: Home / Self Care | Payer: 59 | Source: Ambulatory Visit | Attending: Nurse Practitioner | Admitting: Nurse Practitioner

## 2020-06-24 ENCOUNTER — Other Ambulatory Visit: Payer: Self-pay

## 2020-06-24 DIAGNOSIS — Z1231 Encounter for screening mammogram for malignant neoplasm of breast: Secondary | ICD-10-CM | POA: Diagnosis not present

## 2020-07-08 ENCOUNTER — Ambulatory Visit: Payer: 59 | Admitting: Nurse Practitioner

## 2020-08-12 ENCOUNTER — Encounter: Payer: Self-pay | Admitting: Nurse Practitioner

## 2020-08-12 ENCOUNTER — Other Ambulatory Visit: Payer: Self-pay

## 2020-08-12 ENCOUNTER — Ambulatory Visit (INDEPENDENT_AMBULATORY_CARE_PROVIDER_SITE_OTHER): Payer: 59 | Admitting: Nurse Practitioner

## 2020-08-12 VITALS — BP 128/74 | HR 73 | Ht 60.0 in | Wt 235.0 lb

## 2020-08-12 DIAGNOSIS — I10 Essential (primary) hypertension: Secondary | ICD-10-CM

## 2020-08-12 DIAGNOSIS — E782 Mixed hyperlipidemia: Secondary | ICD-10-CM

## 2020-08-12 DIAGNOSIS — I351 Nonrheumatic aortic (valve) insufficiency: Secondary | ICD-10-CM | POA: Diagnosis not present

## 2020-08-12 DIAGNOSIS — Z720 Tobacco use: Secondary | ICD-10-CM

## 2020-08-12 DIAGNOSIS — R42 Dizziness and giddiness: Secondary | ICD-10-CM

## 2020-08-12 DIAGNOSIS — I5032 Chronic diastolic (congestive) heart failure: Secondary | ICD-10-CM | POA: Diagnosis not present

## 2020-08-12 NOTE — Progress Notes (Signed)
Office Visit    Patient Name: Sarah Mack Date of Encounter: 08/12/2020  Primary Care Provider:  Fayrene Helper, NP Primary Cardiologist:  Lorine Bears, MD  Chief Complaint    65 year old female with a history of HFpEF, hypertension, tobacco abuse, GERD, and mild to moderate aortic insufficiency, who presents for follow-up related to dyspnea and vertigo.  Past Medical History    Past Medical History:  Diagnosis Date  . (HFpEF) heart failure with preserved ejection fraction (HCC)    a. 10/2015 Echo: EF 55-60%, no rwma, mild to mod AI, nl RV fxn.  . Aortic insufficiency    a. 10/2015 Echo: Mild to mod AI.  Marland Kitchen GERD (gastroesophageal reflux disease)   . History of stress test    a. 11/2016 Echo: Small region of mild fixed apical thinning. No ischemia/infarct. Low risk.  Marland Kitchen Hypertension   . Leaky heart valve    Past Surgical History:  Procedure Laterality Date  . COLONOSCOPY WITH PROPOFOL N/A 08/06/2017   Procedure: COLONOSCOPY WITH PROPOFOL;  Surgeon: Midge Minium, MD;  Location[facility] ENDOSCOPY;  Service: Endoscopy;  Laterality: N/A;  . COLONOSCOPY WITH PROPOFOL N/A 03/09/2019   Procedure: COLONOSCOPY WITH PROPOFOL;  Surgeon: Toledo, Boykin Nearing, MD;  Location: [facility] ENDOSCOPY;  Service: Gastroenterology;  Laterality: N/A;  . NO PAST SURGERIES      Allergies  No Known Allergies  History of Present Illness    65 year old female with the above past medical history including HFpEF, hypertension, tobacco abuse, GERD, and aortic insufficiency.  She was previously evaluated with echocardiography in August 2017 showing an EF of 55 to 60% with normal diastolic function parameters.  Mild to moderate aortic insufficiency was noted.  She was last seen in cardiology clinic in September 2018, at which time she reported an episode of dizziness that occurred while driving, without loss of consciousness.  It was associated with substernal chest tightness.  ED evaluation was unremarkable.   She subsequently underwent stress testing, which was low risk.  In May 2021, she was seen in the emergency department with dizziness and subsequently underwent evaluation for vertigo.  She says that she was on meclizine for some time and it seemed to help some but not completely.  She was referred to physical therapy but she does not drive and could not regularly secure a ride and therefore, never went through with it.  She is interested in trying PT, as she continues to have symptoms at least once a month associated with room spinning and shaking, lasting 5 to 10 minutes, resolving spontaneously.  She has chronic dyspnea on exertion which occurs with very limited activity such as light housework or walking short distances.  As result, she does not do very much.  She denies chest pain, palpitations, PND, orthopnea, syncope, edema, or early satiety.  She continues to smoke a pack a day.  Home Medications    Prior to Admission medications   Medication Sig Start Date End Date Taking? Authorizing Provider  ALPRAZolam (XANAX) 0.25 MG tablet Take 1 tablet (0.25 mg total) by mouth at bedtime as needed for anxiety. 06/04/17   Sherrie Mustache Roselyn Bering, PA-C  aspirin 81 MG chewable tablet Chew 81 mg by mouth daily.    [provider]  hydrocortisone cream 1 % Apply 1 application topically 4 (four) times daily. 01/15/19   Cuthriell, Delorise Royals, PA-C  meclizine (ANTIVERT) 25 MG tablet Take 1 tablet (25 mg total) by mouth 3 (three) times daily as needed for  dizziness. 08/24/19   Phineas Semen, MD  montelukast (SINGULAIR) 10 MG tablet Take 10 mg by mouth daily.    [provider]  mupirocin ointment (BACTROBAN) 2 % Apply 1 application topically 3 (three) times daily.    [provider]  ramipril (ALTACE) 5 MG capsule Take 5 mg by mouth daily.    [provider]  rosuvastatin (CRESTOR) 20 MG tablet Take 20 mg by mouth daily.    [provider]  VOLTAREN 1 % GEL Apply 2 g topically  every 8 (eight) hours as needed (for knee pain).     [provider]    Review of Systems    Chronic dyspnea on exertion with limited activity.  Biggest complaint today is that of ongoing vertigo.  She denies chest pain, palpitations, PND, orthopnea, syncope, edema, or early satiety.  All other systems reviewed and are otherwise negative except as noted above.  Physical Exam    VS:  BP 128/74   Pulse 73   Ht 5' (1.524 m)   Wt 235 lb (106.6 kg)   LMP  (LMP Unknown)   BMI 45.90 kg/m  , BMI Body mass index is 45.9 kg/m. GEN: Obese, in no acute distress. HEENT: normal. Neck: Supple, obese, difficult to gauge JVP.  No carotid bruits, or masses. Cardiac: RRR, 2/6 systolic murmur at the upper sternal borders.  I do not appreciate a diastolic murmur.  No rubs, or gallops. No clubbing, cyanosis, edema.  Radials 2+/PT 1+ and equal bilaterally.  Respiratory:  Respirations regular and unlabored, clear to auscultation bilaterally. GI: Obese, soft, nontender, nondistended, BS + x 4. MS: no deformity or atrophy. Skin: warm and dry, no rash. Neuro:  Strength and sensation are intact. Psych: Normal affect.  Accessory Clinical Findings    ECG personally reviewed by me today -sinus arrhythmia, 73 - no acute changes.  Lab Results  Component Value Date   WBC 8.9 08/24/2019   HGB 14.1 08/24/2019   HCT 42.4 08/24/2019   MCV 89.3 08/24/2019   PLT 194 08/24/2019   Lab Results  Component Value Date   CREATININE 1.09 (H) 08/24/2019   BUN 18 08/24/2019   NA 140 08/24/2019   K 3.9 08/24/2019   CL 107 08/24/2019   CO2 24 08/24/2019   Lab Results  Component Value Date   ALT 12 (L) 11/05/2015   AST 16 11/05/2015   ALKPHOS 81 11/05/2015   BILITOT 0.7 11/05/2015    Assessment & Plan    1.  Mild to moderate aortic insufficiency: Previously noted on echocardiogram in August 2017.  She is chronic and somewhat progressive dyspnea on exertion in the setting of morbid obesity, sedentary  lifestyle, and ongoing tobacco abuse.  I will follow-up an echocardiogram to reevaluate EF and AI.  2.  Dyspnea on exertion/HFpEF: Chronic and somewhat progressive dyspnea on exertion.  She does very little at home.  She still smokes a pack a day.  No chest pain.  Body habitus makes exam challenging though she does not appear to be significantly volume overloaded.  Heart rate and blood pressure stable.  Follow-up echo as above.  3.  Essential hypertension: Stable on ACE inhibitor therapy.  4.  Hyperlipidemia: On statin therapy and followed by primary care.  5.  Tobacco abuse: Still smoking 1 pack/day.  Complete cessation advised.  6.  Vertigo: This was diagnosed in 2021.  She has sudden vertiginous symptoms associated with room spinning and shaking about once a month or so,  lasting up to an hour, and resolving spontaneously.  She had some success with meclizine but does not like taking it.  She was previously advised to follow-up with physical therapy but she was unable to due to lack of transportation.  She now thinks her son might be able to get her to and from PT and is interested in a referral.  I will refer back to PT for vestibular training.  I wonder if transportation is an issue in the future if she might qualify for home health PT.  7.  Morbid obesity: Certainly playing a role in her dyspnea on exertion.  Activity is very limited due to dyspnea and unsteadiness with intermittent vertigo.  Encouraged calorie restriction.  8.  Disposition: Follow-up echocardiogram.  PT referral for vestibular training.  Follow-up in clinic in 6 months or sooner if necessary.  Nicolasa Ducking, NP 08/12/2020, 10:48 AM

## 2020-08-12 NOTE — Patient Instructions (Signed)
Medication Instructions:  Your physician recommends that you continue on your current medications as directed. Please refer to the Current Medication list given to you today.  *If you need a refill on your cardiac medications before your next appointment, please call your pharmacy*   Lab Work: None ordered If you have labs (blood work) drawn today and your tests are completely normal, you will receive your results only by: Marland Kitchen MyChart Message (if you have MyChart) OR . A paper copy in the mail If you have any lab test that is abnormal or we need to change your treatment, we will call you to review the results.   Testing/Procedures: Your physician has requested that you have an echocardiogram. Echocardiography is a painless test that uses sound waves to create images of your heart. It provides your doctor with information about the size and shape of your heart and how well your heart's chambers and valves are working. This procedure takes approximately one hour. There are no restrictions for this procedure.     Follow-Up: At [facility], you and your health needs are our priority.  As part of our continuing mission to provide you with exceptional heart care, we have created designated Provider Care Teams.  These Care Teams include your primary Cardiologist (physician) and Advanced Practice Providers (APPs -  Physician Assistants and Nurse Practitioners) who all work together to provide you with the care you need, when you need it.  We recommend signing up for the patient portal called "MyChart".  Sign up information is provided on this After Visit Summary.  MyChart is used to connect with patients for Virtual Visits (Telemedicine).  Patients are able to view lab/test results, encounter notes, upcoming appointments, etc.  Non-urgent messages can be sent to your provider as well.   To learn more about what you can do with MyChart, go to [email or web address].    Your next appointment:    Your physician wants you to follow-up in: 6 months You will receive a reminder letter in the mail two months in advance. If you don't receive a letter, please call our office to schedule the follow-up appointment.   The format for your next appointment:   In Person  Provider:   You may see Lorine Bears, MD or one of the following Advanced Practice Providers on your designated Care Team:    Nicolasa Ducking, NP  Eula Listen, PA-C  Marisue Ivan, PA-C  Cadence [place], [place]  Gillian Shields, NP    Other Instructions You have been referred to [facility] for Physical Therapy. They will call you to schedule.

## 2020-09-19 ENCOUNTER — Other Ambulatory Visit: Payer: Self-pay

## 2020-09-19 ENCOUNTER — Ambulatory Visit (INDEPENDENT_AMBULATORY_CARE_PROVIDER_SITE_OTHER): Payer: 59

## 2020-09-19 DIAGNOSIS — I351 Nonrheumatic aortic (valve) insufficiency: Secondary | ICD-10-CM | POA: Diagnosis not present

## 2020-09-19 LAB — ECHOCARDIOGRAM COMPLETE
AV Vena cont: 0.4 cm
Area-P 1/2: 2.22 cm2
Calc EF: 62.3 %
P 1/2 time: 561 msec
S' Lateral: 3.1 cm
Single Plane A2C EF: 58.9 %
Single Plane A4C EF: 66 %

## 2020-12-03 ENCOUNTER — Emergency Department: Payer: 59

## 2020-12-03 ENCOUNTER — Other Ambulatory Visit: Payer: Self-pay

## 2020-12-03 ENCOUNTER — Emergency Department
Admission: EM | Admit: 2020-12-03 | Discharge: 2020-12-03 | Disposition: A | Discharge status: Home / Self Care | Payer: 59 | Attending: Emergency Medicine | Admitting: Emergency Medicine

## 2020-12-03 DIAGNOSIS — M546 Pain in thoracic spine: Secondary | ICD-10-CM | POA: Diagnosis not present

## 2020-12-03 DIAGNOSIS — Z79899 Other long term (current) drug therapy: Secondary | ICD-10-CM | POA: Diagnosis not present

## 2020-12-03 DIAGNOSIS — Z7982 Long term (current) use of aspirin: Secondary | ICD-10-CM | POA: Insufficient documentation

## 2020-12-03 DIAGNOSIS — F17210 Nicotine dependence, cigarettes, uncomplicated: Secondary | ICD-10-CM | POA: Diagnosis not present

## 2020-12-03 DIAGNOSIS — I509 Heart failure, unspecified: Secondary | ICD-10-CM | POA: Insufficient documentation

## 2020-12-03 DIAGNOSIS — I110 Hypertensive heart disease with heart failure: Secondary | ICD-10-CM | POA: Diagnosis not present

## 2020-12-03 DIAGNOSIS — R079 Chest pain, unspecified: Secondary | ICD-10-CM | POA: Diagnosis present

## 2020-12-03 DIAGNOSIS — J181 Lobar pneumonia, unspecified organism: Secondary | ICD-10-CM | POA: Insufficient documentation

## 2020-12-03 DIAGNOSIS — J189 Pneumonia, unspecified organism: Secondary | ICD-10-CM

## 2020-12-03 LAB — CBC
HCT: 43.1 % (ref 36.0–46.0)
Hemoglobin: 14.3 g/dL (ref 12.0–15.0)
MCH: 30.7 pg (ref 26.0–34.0)
MCHC: 33.2 g/dL (ref 30.0–36.0)
MCV: 92.5 fL (ref 80.0–100.0)
Platelets: 190 10*3/uL (ref 150–400)
RBC: 4.66 MIL/uL (ref 3.87–5.11)
RDW: 14.9 % (ref 11.5–15.5)
WBC: 7.6 10*3/uL (ref 4.0–10.5)
nRBC: 0 % (ref 0.0–0.2)

## 2020-12-03 LAB — BASIC METABOLIC PANEL
Anion gap: 9 (ref 5–15)
BUN: 17 mg/dL (ref 8–23)
CO2: 21 mmol/L — ABNORMAL LOW (ref 22–32)
Calcium: 8.8 mg/dL — ABNORMAL LOW (ref 8.9–10.3)
Chloride: 107 mmol/L (ref 98–111)
Creatinine, Ser: 1.08 mg/dL — ABNORMAL HIGH (ref 0.44–1.00)
GFR, Estimated: 57 mL/min — ABNORMAL LOW (ref >60)
Glucose, Bld: 104 mg/dL — ABNORMAL HIGH (ref 70–99)
Potassium: 4.2 mmol/L (ref 3.5–5.1)
Sodium: 137 mmol/L (ref 135–145)

## 2020-12-03 LAB — TROPONIN I (HIGH SENSITIVITY)
Troponin I (High Sensitivity): 3 ng/L (ref <18)
Troponin I (High Sensitivity): 4 ng/L (ref <18)

## 2020-12-03 MED ORDER — DOXYCYCLINE HYCLATE 100 MG PO TABS: 100.0000 mg | ORAL_TABLET | Freq: Two times a day (BID) | ORAL | 0 refills | Status: AC

## 2020-12-03 MED ORDER — ACETAMINOPHEN 500 MG PO TABS
1000.0000 mg | ORAL_TABLET | Freq: Once | ORAL | Status: AC
  Administered 2020-12-03: 1000 mg via ORAL
  Filled 2020-12-03: qty 2

## 2020-12-03 MED ORDER — LIDOCAINE 5 % EX PTCH
1.0000 | MEDICATED_PATCH | Freq: Once | CUTANEOUS | Status: DC
  Administered 2020-12-03: 1 via TRANSDERMAL
  Filled 2020-12-03: qty 1

## 2020-12-03 MED ORDER — DOXYCYCLINE HYCLATE 100 MG PO TABS
100.0000 mg | ORAL_TABLET | Freq: Once | ORAL | Status: AC
  Administered 2020-12-03: 100 mg via ORAL
  Filled 2020-12-03: qty 1

## 2020-12-03 NOTE — ED Notes (Signed)
Pt up to restroom, no assistance required. Pt back in bed and hooked back up to monitor.

## 2020-12-03 NOTE — ED Provider Notes (Signed)
[facility] Emergency Department Provider Note ____________________________________________   Event Date/Time   First MD Initiated Contact with Patient 12/03/20 1005     (approximate)  I have reviewed the triage vital signs and the nursing notes.  HISTORY  Chief Complaint Chest Pain   HPI Sarah Mack is a 65 y.o. femalewho presents to the ED for evaluation of chest pain.  Chart review indicates [facility].  History of diastolic dysfunction, HTN, tobacco abuse and GERD.  Mild to moderate aortic insufficiency.  Low risk stress testing in 2018.  Patient presents to the ED for evaluation of about 12 hours of atraumatic right chest and right-sided thoracic back pain.  She reports a cough for couple days, and then developing this pain last night.  She reports pain with deep inspiration, sharp "like a knife" when she takes a deep breath, located to her basilar right-sided paraspinal thoracic back.  She does report a cough, minimally productive of some sputum, which is acute for her.  Denies any substernal chest pain, abdominal pain, emesis or diarrhea.  Denies dizziness or syncope.  Past Medical History:  Diagnosis Date   (HFpEF) heart failure with preserved ejection fraction (HCC)    a. 10/2015 Echo: EF 55-60%, no rwma, mild to mod AI, nl RV fxn.   Aortic insufficiency    a. 10/2015 Echo: Mild to mod AI.   GERD (gastroesophageal reflux disease)    History of stress test    a. 11/2016 Echo: Small region of mild fixed apical thinning. No ischemia/infarct. Low risk.   Hypertension    Leaky heart valve     Patient Active Problem List   Diagnosis Date Noted   History of colonic polyps    Hypotension 12/19/2015   Chronic diastolic heart failure (HCC) 11/17/2015   Tobacco use 11/17/2015   Aortic insufficiency 11/17/2015   Tachycardia 11/17/2015    Past Surgical History:  Procedure Laterality Date   COLONOSCOPY WITH PROPOFOL N/A 08/06/2017    Procedure: COLONOSCOPY WITH PROPOFOL;  Surgeon: Midge Minium, MD;  Location: [facility] ENDOSCOPY;  Service: Endoscopy;  Laterality: N/A;   COLONOSCOPY WITH PROPOFOL N/A 03/09/2019   Procedure: COLONOSCOPY WITH PROPOFOL;  Surgeon: Toledo, Boykin Nearing, MD;  Location: [facility] ENDOSCOPY;  Service: Gastroenterology;  Laterality: N/A;   NO PAST SURGERIES      Prior to Admission medications   Medication Sig Start Date End Date Taking? Authorizing Provider  doxycycline (VIBRA-TABS) 100 MG tablet Take 1 tablet (100 mg total) by mouth 2 (two) times daily for 7 days. 12/03/20 12/10/20 Yes Delton Prairie, MD  Aspirin 81 MG CAPS Take 1 capsule by mouth daily.    [provider]  celecoxib (CELEBREX) 200 MG capsule Take 200 mg by mouth 2 (two) times daily. 05/27/20   [provider]  Cholecalciferol (VITAMIN D3) 25 MCG (1000 UT) CAPS Take 1 capsule by mouth daily.    [provider]  loratadine (CLARITIN) 10 MG tablet Take 10 mg by mouth every morning. 05/27/20   [provider]  metoprolol succinate (TOPROL-XL) 50 MG 24 hr tablet Take 50 mg by mouth at bedtime. 09/23/20   [provider]  montelukast (SINGULAIR) 10 MG tablet Take 10 mg by mouth daily.    [provider]  ramipril (ALTACE) 10 MG capsule Take 10 mg by mouth every morning. 05/29/20   [provider]  rosuvastatin (CRESTOR) 20 MG tablet Take 20 mg by mouth daily.    [provider]  Allergies Patient has no known allergies.  Family History  Problem Relation Age of Onset   Liver cancer Mother    Heart failure Father    Diabetes Father    Hypertension Sister    Hypertension Brother    Leukemia Sister    Hypertension Brother    Alcoholism Brother    Hypertension Brother    Breast cancer Neg Hx     Social History Social History   Tobacco Use   Smoking status: Every Day    Packs/day: 0.50    Years: 10.00    Pack years: 5.00    Types: Cigarettes   Smokeless tobacco: Never   Vaping Use   Vaping Use: Never used  Substance Use Topics   Alcohol use: No   Drug use: No    Review of Systems  Constitutional: No fever/chills Eyes: No visual changes. ENT: No sore throat. Cardiovascular: Denies chest pain. Respiratory:  Positive for productive cough and dyspnea on exertion Gastrointestinal: No abdominal pain.  No nausea, no vomiting.  No diarrhea.  No constipation. Genitourinary: Negative for dysuria. Musculoskeletal: Positive for atraumatic right-sided thoracic back pain. Skin: Negative for rash. Neurological: Negative for headaches, focal weakness or numbness. ____________________________________________   PHYSICAL EXAM:  VITAL SIGNS: Vitals:   12/03/20 0950 12/03/20 1247  BP:  130/70  Pulse:  60  Resp:  17  Temp: 97.9 F (36.6 C)   SpO2:  98%     Constitutional: Alert and oriented. Well appearing and in no acute distress. Eyes: Conjunctivae are normal. PERRL. EOMI. Head: Atraumatic. Nose: No congestion/rhinnorhea. Mouth/Throat: Mucous membranes are moist.  Oropharynx non-erythematous. Neck: No stridor. No cervical spine tenderness to palpation. Cardiovascular: Normal rate, regular rhythm. Grossly normal heart sounds.  Good peripheral circulation. Respiratory: Normal respiratory effort.  No retractions.  Faint basilar right-sided inspiratory crackles Gastrointestinal: Soft , nondistended, nontender to palpation. No CVA tenderness. Musculoskeletal: No lower extremity tenderness nor edema.  No joint effusions. No signs of acute trauma. Right-sided paraspinal thoracic tenderness to palpation without overlying signs of trauma or skin changes.  No vesicles to suggest herpes zoster. Neurologic:  Normal speech and language. No gross focal neurologic deficits are appreciated. No gait instability noted. Skin:  Skin is warm, dry and intact. No rash noted. Psychiatric: Mood and affect are normal. Speech and behavior are  normal.  ____________________________________________   LABS (all labs ordered are listed, but only abnormal results are displayed)  Labs Reviewed  BASIC METABOLIC PANEL - Abnormal; Notable for the following components:      Result Value   CO2 21 (*)    Glucose, Bld 104 (*)    Creatinine, Ser 1.08 (*)    Calcium 8.8 (*)    GFR, Estimated 57 (*)    All other components within normal limits  CBC  TROPONIN I (HIGH SENSITIVITY)  TROPONIN I (HIGH SENSITIVITY)   ____________________________________________  12 Lead EKG  Sinus rhythm with a rate of 62 bpm.  Normal axis and intervals.  Nonspecific ST changes inferiorly without STEMI criteria. Similar to EKG from May 2022 ____________________________________________  RADIOLOGY  ED MD interpretation: 2 view CXR reviewed by me with opacity to the right lung base  Official radiology report(s): DG Chest 2 View  Result Date: 12/03/2020 CLINICAL DATA:  Chest pain EXAM: CHEST - 2 VIEW COMPARISON:  November 21, 2016 FINDINGS: There is mild patchy opacity of the right lung base. There is no pulmonary edema or pleural effusion. The mediastinal contour and cardiac silhouette are normal.  The osseous structures are stable. IMPRESSION: Mild patchy opacity of the right lung base, developing pneumonia is not excluded. Electronically Signed   By: Sherian Rein M.D.   On: 12/03/2020 10:21    ____________________________________________   PROCEDURES and INTERVENTIONS  Procedure(s) performed (including Critical Care):  .1-3 Lead EKG Interpretation Performed by: Delton Prairie, MD Authorized by: Delton Prairie, MD     Interpretation: normal     ECG rate:  60   ECG rate assessment: normal     Rhythm: sinus rhythm     Ectopy: none     Conduction: normal    Medications  lidocaine (LIDODERM) 5 % 1 patch (1 patch Transdermal Patch Applied 12/03/20 1109)  acetaminophen (TYLENOL) tablet 1,000 mg (1,000 mg Oral Given 12/03/20 1109)  doxycycline  (VIBRA-TABS) tablet 100 mg (100 mg Oral Given 12/03/20 1215)    ____________________________________________   MDM / ED COURSE   65 year old woman presents to the ED with chest pain and cough, with evidence of CAP, amenable to outpatient management.  Normal vitals on room air.  Exam with occasional coughing and right basilar crackles, but she otherwise looks well.  No distress, signs of neurologic or vascular deficits.  Work-up is benign without evidence of sepsis, ACS, PTX or significant metabolic derangements.  Right basilar infiltrate is noted on CXR interdigitations most consistent with CAP and I see no barriers to outpatient management.  We will start on doxycycline and discharged with return precautions.  Clinical Course as of 12/03/20 1334  Sat Dec 03, 2020  1334 Reassessed.  Patient comfortably asleep with normal vital signs.  I awaken her with voice and she reports feeling better.  Requesting discharge.  We discussed return precautions. [DS]    Clinical Course User Index [DS] Delton Prairie, MD    ____________________________________________   FINAL CLINICAL IMPRESSION(S) / ED DIAGNOSES  Final diagnoses:  Community acquired pneumonia of right lower lobe of lung     ED Discharge Orders          Ordered    doxycycline (VIBRA-TABS) 100 MG tablet  2 times daily        12/03/20 1104             Harley Fitzwater   Note:  This document was prepared using Conservation officer, historic buildings and may include unintentional dictation errors.    Delton Prairie, MD 12/03/20 1335

## 2020-12-03 NOTE — ED Notes (Signed)
See triage note, pt reports right shoulder/chest pain that worsens with ambulation. NAD noted. Speaking in complete sentences, RR even and unlabored

## 2020-12-03 NOTE — ED Triage Notes (Addendum)
Pt arrived to ed via pov ambulatory to triage. Pt c/o right sided Cp that radiated to back with deep inhalation starting last night. Pt denies sob. NAD noted at this time.

## 2020-12-03 NOTE — Discharge Instructions (Signed)
Use Tylenol for pain and fevers.  Up to 1000 mg per dose, up to 4 times per day.  Do not take more than 4000 mg of Tylenol/acetaminophen within 24 hours..  Use naproxen/Aleve for anti-inflammatory pain relief. Use up to 500mg  every 12 hours. Do not take more frequently than this. Do not use other NSAIDs (ibuprofen, Advil) while taking this medication. It is safe to take Tylenol with this.   You are being discharged with an antibiotic doxycycline to take twice daily for the next week to treat a pneumonia. I would recommend taking a probiotic, or just eating yogurt, while taking this medication to prevent diarrhea.  If you develop any further worsening symptoms despite these measures, please return to the ED.

## 2021-05-12 ENCOUNTER — Other Ambulatory Visit: Payer: Self-pay | Admitting: Nurse Practitioner

## 2021-05-12 DIAGNOSIS — Z1231 Encounter for screening mammogram for malignant neoplasm of breast: Secondary | ICD-10-CM

## 2021-06-27 ENCOUNTER — Ambulatory Visit
Admission: RE | Admit: 2021-06-27 | Discharge: 2021-06-27 | Disposition: A | Discharge status: Home / Self Care | Payer: Medicare Other | Source: Ambulatory Visit | Attending: Nurse Practitioner | Admitting: Nurse Practitioner

## 2021-06-27 DIAGNOSIS — Z1231 Encounter for screening mammogram for malignant neoplasm of breast: Secondary | ICD-10-CM | POA: Diagnosis not present

## 2021-09-13 DIAGNOSIS — E785 Hyperlipidemia, unspecified: Secondary | ICD-10-CM | POA: Diagnosis not present

## 2021-09-13 DIAGNOSIS — R7303 Prediabetes: Secondary | ICD-10-CM | POA: Diagnosis not present

## 2021-09-13 DIAGNOSIS — I10 Essential (primary) hypertension: Secondary | ICD-10-CM | POA: Diagnosis not present

## 2021-09-15 DIAGNOSIS — R7303 Prediabetes: Secondary | ICD-10-CM | POA: Diagnosis not present

## 2021-09-15 DIAGNOSIS — M1990 Unspecified osteoarthritis, unspecified site: Secondary | ICD-10-CM | POA: Diagnosis not present

## 2021-09-15 DIAGNOSIS — J309 Allergic rhinitis, unspecified: Secondary | ICD-10-CM | POA: Diagnosis not present

## 2021-09-15 DIAGNOSIS — I10 Essential (primary) hypertension: Secondary | ICD-10-CM | POA: Diagnosis not present

## 2021-09-15 DIAGNOSIS — J302 Other seasonal allergic rhinitis: Secondary | ICD-10-CM | POA: Diagnosis not present

## 2021-09-15 DIAGNOSIS — E785 Hyperlipidemia, unspecified: Secondary | ICD-10-CM | POA: Diagnosis not present

## 2021-09-22 DIAGNOSIS — I340 Nonrheumatic mitral (valve) insufficiency: Secondary | ICD-10-CM | POA: Diagnosis not present

## 2021-09-22 DIAGNOSIS — E785 Hyperlipidemia, unspecified: Secondary | ICD-10-CM | POA: Diagnosis not present

## 2021-09-22 DIAGNOSIS — I351 Nonrheumatic aortic (valve) insufficiency: Secondary | ICD-10-CM | POA: Diagnosis not present

## 2021-09-22 DIAGNOSIS — R0602 Shortness of breath: Secondary | ICD-10-CM | POA: Diagnosis not present

## 2021-09-22 DIAGNOSIS — R9431 Abnormal electrocardiogram [ECG] [EKG]: Secondary | ICD-10-CM | POA: Diagnosis not present

## 2021-09-22 DIAGNOSIS — I10 Essential (primary) hypertension: Secondary | ICD-10-CM | POA: Diagnosis not present

## 2021-09-22 DIAGNOSIS — F17200 Nicotine dependence, unspecified, uncomplicated: Secondary | ICD-10-CM | POA: Diagnosis not present

## 2021-10-05 DIAGNOSIS — I340 Nonrheumatic mitral (valve) insufficiency: Secondary | ICD-10-CM | POA: Diagnosis not present

## 2021-10-05 DIAGNOSIS — I739 Peripheral vascular disease, unspecified: Secondary | ICD-10-CM | POA: Diagnosis not present

## 2021-10-05 DIAGNOSIS — I248 Other forms of acute ischemic heart disease: Secondary | ICD-10-CM | POA: Diagnosis not present

## 2021-10-05 DIAGNOSIS — I351 Nonrheumatic aortic (valve) insufficiency: Secondary | ICD-10-CM | POA: Diagnosis not present

## 2021-10-05 DIAGNOSIS — R0602 Shortness of breath: Secondary | ICD-10-CM | POA: Diagnosis not present

## 2021-10-06 DIAGNOSIS — I739 Peripheral vascular disease, unspecified: Secondary | ICD-10-CM | POA: Diagnosis not present

## 2021-10-06 DIAGNOSIS — I248 Other forms of acute ischemic heart disease: Secondary | ICD-10-CM | POA: Diagnosis not present

## 2021-10-06 DIAGNOSIS — I351 Nonrheumatic aortic (valve) insufficiency: Secondary | ICD-10-CM | POA: Diagnosis not present

## 2021-10-06 DIAGNOSIS — I340 Nonrheumatic mitral (valve) insufficiency: Secondary | ICD-10-CM | POA: Diagnosis not present

## 2021-10-09 DIAGNOSIS — E785 Hyperlipidemia, unspecified: Secondary | ICD-10-CM | POA: Diagnosis not present

## 2021-10-09 DIAGNOSIS — R0602 Shortness of breath: Secondary | ICD-10-CM | POA: Diagnosis not present

## 2021-10-09 DIAGNOSIS — I340 Nonrheumatic mitral (valve) insufficiency: Secondary | ICD-10-CM | POA: Diagnosis not present

## 2021-10-09 DIAGNOSIS — I10 Essential (primary) hypertension: Secondary | ICD-10-CM | POA: Diagnosis not present

## 2021-10-09 DIAGNOSIS — I351 Nonrheumatic aortic (valve) insufficiency: Secondary | ICD-10-CM | POA: Diagnosis not present

## 2021-10-17 DIAGNOSIS — I10 Essential (primary) hypertension: Secondary | ICD-10-CM | POA: Diagnosis not present

## 2021-10-17 DIAGNOSIS — R0602 Shortness of breath: Secondary | ICD-10-CM | POA: Diagnosis not present

## 2021-10-17 DIAGNOSIS — E785 Hyperlipidemia, unspecified: Secondary | ICD-10-CM | POA: Diagnosis not present

## 2021-10-17 DIAGNOSIS — I3489 Other nonrheumatic mitral valve disorders: Secondary | ICD-10-CM | POA: Diagnosis not present

## 2021-10-17 DIAGNOSIS — I351 Nonrheumatic aortic (valve) insufficiency: Secondary | ICD-10-CM | POA: Diagnosis not present

## 2021-10-17 DIAGNOSIS — I340 Nonrheumatic mitral (valve) insufficiency: Secondary | ICD-10-CM | POA: Diagnosis not present

## 2021-10-20 DIAGNOSIS — R0602 Shortness of breath: Secondary | ICD-10-CM | POA: Diagnosis not present

## 2021-10-20 DIAGNOSIS — I351 Nonrheumatic aortic (valve) insufficiency: Secondary | ICD-10-CM | POA: Diagnosis not present

## 2021-10-20 DIAGNOSIS — I10 Essential (primary) hypertension: Secondary | ICD-10-CM | POA: Diagnosis not present

## 2021-10-20 DIAGNOSIS — I3489 Other nonrheumatic mitral valve disorders: Secondary | ICD-10-CM | POA: Diagnosis not present

## 2021-10-20 DIAGNOSIS — I340 Nonrheumatic mitral (valve) insufficiency: Secondary | ICD-10-CM | POA: Diagnosis not present

## 2021-11-02 ENCOUNTER — Telehealth: Payer: Self-pay | Admitting: Cardiovascular Disease

## 2021-11-02 NOTE — Telephone Encounter (Signed)
Patient changed to [facility] deleting recall

## 2021-12-29 DIAGNOSIS — I10 Essential (primary) hypertension: Secondary | ICD-10-CM | POA: Diagnosis not present

## 2021-12-29 DIAGNOSIS — R7303 Prediabetes: Secondary | ICD-10-CM | POA: Diagnosis not present

## 2021-12-29 DIAGNOSIS — N189 Chronic kidney disease, unspecified: Secondary | ICD-10-CM | POA: Diagnosis not present

## 2021-12-29 DIAGNOSIS — E785 Hyperlipidemia, unspecified: Secondary | ICD-10-CM | POA: Diagnosis not present

## 2021-12-29 DIAGNOSIS — Z0001 Encounter for general adult medical examination with abnormal findings: Secondary | ICD-10-CM | POA: Diagnosis not present

## 2021-12-29 DIAGNOSIS — Z23 Encounter for immunization: Secondary | ICD-10-CM | POA: Diagnosis not present

## 2022-02-23 DIAGNOSIS — E785 Hyperlipidemia, unspecified: Secondary | ICD-10-CM | POA: Diagnosis not present

## 2022-02-23 DIAGNOSIS — I10 Essential (primary) hypertension: Secondary | ICD-10-CM | POA: Diagnosis not present

## 2022-02-23 DIAGNOSIS — R0602 Shortness of breath: Secondary | ICD-10-CM | POA: Diagnosis not present

## 2022-02-23 DIAGNOSIS — I351 Nonrheumatic aortic (valve) insufficiency: Secondary | ICD-10-CM | POA: Diagnosis not present

## 2022-02-23 DIAGNOSIS — I340 Nonrheumatic mitral (valve) insufficiency: Secondary | ICD-10-CM | POA: Diagnosis not present

## 2022-03-29 DIAGNOSIS — I340 Nonrheumatic mitral (valve) insufficiency: Secondary | ICD-10-CM | POA: Diagnosis not present

## 2022-03-29 DIAGNOSIS — I351 Nonrheumatic aortic (valve) insufficiency: Secondary | ICD-10-CM | POA: Diagnosis not present

## 2022-03-29 DIAGNOSIS — E785 Hyperlipidemia, unspecified: Secondary | ICD-10-CM | POA: Diagnosis not present

## 2022-03-29 DIAGNOSIS — I10 Essential (primary) hypertension: Secondary | ICD-10-CM | POA: Diagnosis not present

## 2022-03-30 DIAGNOSIS — E785 Hyperlipidemia, unspecified: Secondary | ICD-10-CM | POA: Diagnosis not present

## 2022-03-30 DIAGNOSIS — R7303 Prediabetes: Secondary | ICD-10-CM | POA: Diagnosis not present

## 2022-03-30 DIAGNOSIS — I10 Essential (primary) hypertension: Secondary | ICD-10-CM | POA: Diagnosis not present

## 2022-04-02 DIAGNOSIS — N189 Chronic kidney disease, unspecified: Secondary | ICD-10-CM | POA: Diagnosis not present

## 2022-04-02 DIAGNOSIS — I10 Essential (primary) hypertension: Secondary | ICD-10-CM | POA: Diagnosis not present

## 2022-04-02 DIAGNOSIS — M1990 Unspecified osteoarthritis, unspecified site: Secondary | ICD-10-CM | POA: Diagnosis not present

## 2022-04-02 DIAGNOSIS — R7303 Prediabetes: Secondary | ICD-10-CM | POA: Diagnosis not present

## 2022-04-02 DIAGNOSIS — E785 Hyperlipidemia, unspecified: Secondary | ICD-10-CM | POA: Diagnosis not present

## 2022-04-03 ENCOUNTER — Other Ambulatory Visit: Payer: Self-pay | Admitting: Nurse Practitioner

## 2022-04-03 DIAGNOSIS — Z1231 Encounter for screening mammogram for malignant neoplasm of breast: Secondary | ICD-10-CM

## 2022-05-28 ENCOUNTER — Ambulatory Visit (INDEPENDENT_AMBULATORY_CARE_PROVIDER_SITE_OTHER): Payer: 59 | Admitting: Cardiovascular Disease

## 2022-05-28 ENCOUNTER — Encounter: Payer: Self-pay | Admitting: Cardiovascular Disease

## 2022-05-28 VITALS — BP 130/70 | HR 61 | Ht 60.0 in | Wt 233.8 lb

## 2022-05-28 DIAGNOSIS — Z720 Tobacco use: Secondary | ICD-10-CM

## 2022-05-28 DIAGNOSIS — E782 Mixed hyperlipidemia: Secondary | ICD-10-CM | POA: Diagnosis not present

## 2022-05-28 DIAGNOSIS — R000 Tachycardia, unspecified: Secondary | ICD-10-CM

## 2022-05-28 DIAGNOSIS — I10 Essential (primary) hypertension: Secondary | ICD-10-CM

## 2022-05-28 DIAGNOSIS — I351 Nonrheumatic aortic (valve) insufficiency: Secondary | ICD-10-CM | POA: Diagnosis not present

## 2022-05-28 DIAGNOSIS — I5032 Chronic diastolic (congestive) heart failure: Secondary | ICD-10-CM

## 2022-05-28 NOTE — Progress Notes (Signed)
Cardiology Office Note   Date:  05/28/2022   ID:  Dejae, Dewindt 1955/05/15, MRN [id number]  PCP:  Evern Bio, NP  Cardiologist:  Neoma Laming, MD      History of Present Illness: Sarah Mack is a 67 y.o. female who presents for  Chief Complaint  Patient presents with   Follow-up    2 month followup    Patient in office for routine cardiac exam. Denies chest pain. Compkains of shortness of breath, coughing.     Past Medical History:  Diagnosis Date   (HFpEF) heart failure with preserved ejection fraction ([place])    a. 10/2015 Echo: EF 55-60%, no rwma, mild to mod AI, nl RV fxn.   Aortic insufficiency    a. 10/2015 Echo: Mild to mod AI.   GERD (gastroesophageal reflux disease)    History of stress test    a. 11/2016 Echo: Small region of mild fixed apical thinning. No ischemia/infarct. Low risk.   Hypertension    Leaky heart valve      Past Surgical History:  Procedure Laterality Date   COLONOSCOPY WITH PROPOFOL N/A 08/06/2017   Procedure: COLONOSCOPY WITH PROPOFOL;  Surgeon: Lucilla Lame, MD;  Location: [facility] ENDOSCOPY;  Service: Endoscopy;  Laterality: N/A;   COLONOSCOPY WITH PROPOFOL N/A 03/09/2019   Procedure: COLONOSCOPY WITH PROPOFOL;  Surgeon: Toledo, Benay Pike, MD;  Location: [facility] ENDOSCOPY;  Service: Gastroenterology;  Laterality: N/A;   NO PAST SURGERIES       Current Outpatient Medications  Medication Sig Dispense Refill   albuterol (VENTOLIN HFA) 108 (90 Base) MCG/ACT inhaler Inhale 1 puff into the lungs as needed for wheezing or shortness of breath.     Aspirin 81 MG CAPS Take 1 capsule by mouth daily.     celecoxib (CELEBREX) 200 MG capsule Take 200 mg by mouth 2 (two) times daily.     Cholecalciferol (VITAMIN D3) 25 MCG (1000 UT) CAPS Take 1 capsule by mouth daily.     loratadine (CLARITIN) 10 MG tablet Take 10 mg by mouth every morning.     metoprolol succinate (TOPROL-XL) 50 MG 24 hr tablet Take 50 mg by mouth at bedtime.      montelukast (SINGULAIR) 10 MG tablet Take 10 mg by mouth daily.     ramipril (ALTACE) 10 MG capsule Take 10 mg by mouth every morning.     rosuvastatin (CRESTOR) 20 MG tablet Take 20 mg by mouth daily.     spironolactone (ALDACTONE) 25 MG tablet Take 25 mg by mouth daily.     No current facility-administered medications for this visit.    Allergies:   Patient has no known allergies.    Social History:   reports that she has been smoking cigarettes. She has a 5.00 pack-year smoking history. She has never used smokeless tobacco. She reports that she does not drink alcohol and does not use drugs.   Family History:  family history includes Alcoholism in her brother; Diabetes in her father; Heart failure in her father; Hypertension in her brother, brother, brother, and sister; Leukemia in her sister; Liver cancer in her mother.    ROS:     Review of Systems  Constitutional: Negative.   HENT: Negative.    Eyes: Negative.   Respiratory: Negative.    Gastrointestinal: Negative.   Genitourinary: Negative.   Musculoskeletal: Negative.   Skin: Negative.   Neurological: Negative.   Endo/Heme/Allergies: Negative.   Psychiatric/Behavioral: Negative.    All other systems reviewed  and are negative.     All other systems are reviewed and negative.    PHYSICAL EXAM: VS:  BP 130/70   Pulse 61   Ht 5' (1.524 m)   Wt 233 lb 12.8 oz (106.1 kg)   LMP  (LMP Unknown)   SpO2 94%   BMI 45.66 kg/m  , BMI Body mass index is 45.66 kg/m. Last weight:  Wt Readings from Last 3 Encounters:  05/28/22 233 lb 12.8 oz (106.1 kg)  12/03/20 215 lb (97.5 kg)  08/12/20 235 lb (106.6 kg)     Physical Exam Constitutional:      Appearance: Normal appearance.  Cardiovascular:     Rate and Rhythm: Normal rate and regular rhythm.     Heart sounds: Normal heart sounds.  Pulmonary:     Effort: Pulmonary effort is normal.     Breath sounds: Normal breath sounds.  Musculoskeletal:     Right lower leg: No  edema.     Left lower leg: No edema.  Neurological:     Mental Status: She is alert.     EKG: none today  Recent Labs: No results found for requested labs within last 365 days.    Lipid Panel No results found for: "CHOL", "TRIG", "HDL", "CHOLHDL", "VLDL", "LDLCALC", "LDLDIRECT"    Other studies Reviewed: Patient: Sarah Mack DOB:  1955-07-14  Date:  10/17/2021 11:00 Provider: Neoma Laming MD Encounter: ALL ANGIOGRAMS (CTA BRAIN, CAROTIDS, RENAL ARTERIES, PE)   Page 1 REASON FOR VISIT  Referred by Dr.Devanny Palecek Humphrey Rolls.   TESTS  Imaging: Computed Tomographic Angiography:  Cardiac multidetector CT was performed paying particular attention to the coronary arteries for the diagnosis of: Shortness of breath. Diagnostic Drugs:  Administered iohexol (Omnipaque) through an antecubital vein and images from the examination were analyzed for the presence and extent of coronary artery disease, using 3D image processing software. 100 mL of non-ionic contrast (Omnipaque) was used.     TEST CONCLUSIONS  Quality of study: Suboptimal/Poor  1-Calcium score: 0  2-Right domainat system.  3-Normal coronaries.   Neoma Laming MD  Electronically signed by: Neoma Laming     Date: 10/20/2021 08:48  Patient: Sarah Mack DOB:  Jul 19, 1955  Date:  10/06/2021 08:00 Provider: Neoma Laming MD Encounter: NUCLEAR STRESS TEST   Page 1 TESTS    [facility] [address] [place], [place] [address] [phone] STUDY:  Gated Stress / Rest Myocardial Perfusion Imaging Tomographic (SPECT) Including attenuation correction Wall Motion, Left Ventricular Ejection Fraction By Gated Technique.Persantine Stress Test. SEX: Female  WEIGHT: 231 lbs  HEIGHT: 60 in      ARMS UP: YES/NO  REFERRING PHYSICIAN: Dr.Ameilia Rattan Humphrey Rolls                                                                                                                                                                                                                       INDICATION FOR STUDY: SOB                                                                                                                                                                                                                    TECHNIQUE:  Approximately 20 minutes following the intravenous administration of 11.0 mCi of Tc-42mSestamibi after stress testing in a reclined supine position with arms above their head if able to do so, gated SPECT imaging of the heart was performed. After about a 2hr break, the patient was injected intravenously with 31.9 mCi of Tc-930mestamibi.  Approximately 45 minutes later in the same position as stress imaging SPECT rest imaging of the heart was performed.  STRESS BY:  ShNeoma LamingMD PROTOCOL:  Persantine   DOSE ADMIN: 10 CC     ROUTE OF ADMINISTRATION: IV  MAX PRED HR:  155                    85%: 132               75%:  116                                                                                                                  RESTING BP:  132/78   RESTING HR: 69   PEAK BP: 128/76   PEAK HR: 77                                                                    EXERCISE DURATION:    4 min injection                                            REASON FOR TEST TERMINATION:    Protocol end                                                                                                                              SYMPTOMS:   None  EKG  RESULTS: Baseline. NSR. 69/min. No significant ST changes with persantine.                                                             IMAGE QUALITY:  Good                                                                                                                                                                                                                                                                                                                                 PERFUSION/WALL MOTION FINDINGS: EF = 76%. No perfusion defects, normal wall motion.                                                                           IMPRESSION:  Normal stress test with normal LVEF.  Neoma Laming, MD Stress Interpreting Physician / Nuclear Interpreting Physician        Neoma Laming MD  Electronically signed by: Neoma Laming     Date: 10/10/2021 09:56  Patient: Sarah Mack DOB:  Apr 11, 1955  Date:  10/05/2021 11:30 Provider: Neoma Laming MD Encounter: ECHO   Page 2 REASON FOR VISIT  Visit for: Echocardiogram/R 06.02  Sex:   Female  wt= 229   lbs.  BP=128/62  Height= 59   inches.    TESTS  Imaging: Echocardiogram:  An echocardiogram in (2-d) mode was performed and in Doppler mode with color flow velocity mapping was performed. The aortic valve cusps are abnormal 1.1   cm, flow velocity 2.44   m/s, and systolic calculated mean flow gradient 13  mmHg. Mitral valve diastolic peak flow velocity E .884    m/s and E/A ratio 0.9. Aortic root diameter 2.8   cm. The LVOT internal diameter 3.0  cm and flow velocity was abnormal 1.63   m/s. LV systolic dimension [id number]   cm, diastolic [id number]  cm, posterior wall thickness  1.41   cm, fractional shortening 32.3  %, and EF 61.7  %. IVS thickness 1.65   cm. LA dimension 3.2  cm. Aortic Valve has Moderate-Severe Regurgitation. Mitral Valve has Moderate Regurgitation. Pulmonic Valve has Trace Regurgitation. Tricuspid Valve has Trace Regurgitation.     ASSESSMENT  Technically adequate study.  Normal chamber sizes.  Normal left ventricular systolic function.  Mild left ventricular hypertrophy with GRADE 3 ( restrictive physiology) diastolic dysfunction.  Normal right ventricular systolic function.  Normal right ventricular diastolic function.  Normal left ventricular wall motion.  Normal right ventricular wall motion.  Trace pulmonary regurgitation.  Trace tricuspid regurgitation.  Borderline pulmonary hypertension.  Moderate mitral regurgitation.  Moderate to Severe aortic regurgitation.  No pericardial effusion.  Mildly dilated Left atrium  Moderate LVH.    THERAPY   Referring physician: Dionisio David  Sonographer: Neoma Laming.   Neoma Laming MD  Electronically signed by: Neoma Laming     Date: 10/05/2021 13:51   ASSESSMENT AND PLAN:    ICD-10-CM   1. Nonrheumatic aortic valve insufficiency  I35.1 PCV ECHOCARDIOGRAM COMPLETE    2. Chronic diastolic heart failure (HCC)  I50.32     3. Tobacco use  Z72.0     4. Tachycardia  R00.0     5. Mixed hyperlipidemia  E78.2     6. Primary hypertension  I10        Problem List Items Addressed This Visit       Cardiovascular and Mediastinum   Chronic diastolic heart failure (HCC) (Chronic)   Relevant Medications   spironolactone (ALDACTONE) 25 MG tablet   Aortic insufficiency - Primary (Chronic)    Patient complains of shortness of breath on exertion. Productive cough at night when laying down. Echo 09/2021 normal EF, mod-severe AVR, mod. MVR. Will repeat echo.       Relevant Medications   spironolactone (ALDACTONE) 25 MG tablet   Other Relevant Orders   PCV ECHOCARDIOGRAM COMPLETE      Other   Tobacco use (Chronic)   Tachycardia (Chronic)   Mixed hyperlipidemia   Relevant Medications   spironolactone (ALDACTONE) 25 MG tablet   Other Visit Diagnoses     Primary hypertension       Relevant Medications   spironolactone (ALDACTONE) 25 MG tablet       Disposition:   Return in about 4 weeks (around 06/25/2022) for  after echo.    Total time spent: 30 minutes  Signed,  Neoma Laming, MD  05/28/2022 1:21 PM    [facility]

## 2022-05-28 NOTE — Assessment & Plan Note (Signed)
Patient complains of shortness of breath on exertion. Productive cough at night when laying down. Echo 09/2021 normal EF, mod-severe AVR, mod. MVR. Will repeat echo.

## 2022-06-15 ENCOUNTER — Ambulatory Visit (INDEPENDENT_AMBULATORY_CARE_PROVIDER_SITE_OTHER): Payer: 59

## 2022-06-15 DIAGNOSIS — I351 Nonrheumatic aortic (valve) insufficiency: Secondary | ICD-10-CM | POA: Diagnosis not present

## 2022-06-15 DIAGNOSIS — I340 Nonrheumatic mitral (valve) insufficiency: Secondary | ICD-10-CM | POA: Diagnosis not present

## 2022-06-15 DIAGNOSIS — I361 Nonrheumatic tricuspid (valve) insufficiency: Secondary | ICD-10-CM | POA: Diagnosis not present

## 2022-06-21 ENCOUNTER — Ambulatory Visit (INDEPENDENT_AMBULATORY_CARE_PROVIDER_SITE_OTHER): Payer: 59 | Admitting: Cardiovascular Disease

## 2022-06-21 ENCOUNTER — Encounter: Payer: Self-pay | Admitting: Cardiovascular Disease

## 2022-06-21 VITALS — BP 140/80 | HR 58 | Ht 64.0 in | Wt 235.6 lb

## 2022-06-21 DIAGNOSIS — I351 Nonrheumatic aortic (valve) insufficiency: Secondary | ICD-10-CM

## 2022-06-21 DIAGNOSIS — R5383 Other fatigue: Secondary | ICD-10-CM | POA: Diagnosis not present

## 2022-06-21 DIAGNOSIS — E782 Mixed hyperlipidemia: Secondary | ICD-10-CM

## 2022-06-21 DIAGNOSIS — I5032 Chronic diastolic (congestive) heart failure: Secondary | ICD-10-CM

## 2022-06-21 DIAGNOSIS — I340 Nonrheumatic mitral (valve) insufficiency: Secondary | ICD-10-CM | POA: Diagnosis not present

## 2022-06-21 NOTE — Assessment & Plan Note (Signed)
Echo 05/2022 moderate mitral regurgiation

## 2022-06-21 NOTE — Assessment & Plan Note (Signed)
Echo 05/2022 mod-severe aortic regurgitation. Will continue to monitor.

## 2022-06-21 NOTE — Progress Notes (Signed)
Cardiology Office Note   Date:  06/21/2022   ID:  Meghna, Bosch [birth year], MRN [id number]  PCP:  Evern Bio, NP  Cardiologist:  Neoma Laming, MD      History of Present Illness: Sarah Mack is a 67 y.o. female who presents for  Chief Complaint  Patient presents with   Follow-up    Echo results    Patient in office for one month follow up, discuss echo results. Complains of feeling fatigued.     Past Medical History:  Diagnosis Date   (HFpEF) heart failure with preserved ejection fraction ([place])    a. 10/2015 Echo: EF 55-60%, no rwma, mild to mod AI, nl RV fxn.   Aortic insufficiency    a. 10/2015 Echo: Mild to mod AI.   GERD (gastroesophageal reflux disease)    History of stress test    a. 11/2016 Echo: Small region of mild fixed apical thinning. No ischemia/infarct. Low risk.   Hypertension    Leaky heart valve      Past Surgical History:  Procedure Laterality Date   COLONOSCOPY WITH PROPOFOL N/A 08/06/2017   Procedure: COLONOSCOPY WITH PROPOFOL;  Surgeon: Lucilla Lame, MD;  Location: [facility] ENDOSCOPY;  Service: Endoscopy;  Laterality: N/A;   COLONOSCOPY WITH PROPOFOL N/A 03/09/2019   Procedure: COLONOSCOPY WITH PROPOFOL;  Surgeon: Toledo, Benay Pike, MD;  Location: [facility] ENDOSCOPY;  Service: Gastroenterology;  Laterality: N/A;   NO PAST SURGERIES       Current Outpatient Medications  Medication Sig Dispense Refill   albuterol (VENTOLIN HFA) 108 (90 Base) MCG/ACT inhaler Inhale 1 puff into the lungs as needed for wheezing or shortness of breath.     Aspirin 81 MG CAPS Take 1 capsule by mouth daily.     celecoxib (CELEBREX) 200 MG capsule Take 200 mg by mouth 2 (two) times daily.     Cholecalciferol (VITAMIN D3) 25 MCG (1000 UT) CAPS Take 1 capsule by mouth daily.     loratadine (CLARITIN) 10 MG tablet Take 10 mg by mouth every morning.     metoprolol succinate (TOPROL-XL) 50 MG 24 hr tablet Take 50 mg by mouth at bedtime.     montelukast (SINGULAIR)  10 MG tablet Take 10 mg by mouth daily.     ramipril (ALTACE) 10 MG capsule Take 10 mg by mouth every morning.     rosuvastatin (CRESTOR) 20 MG tablet Take 20 mg by mouth daily.     spironolactone (ALDACTONE) 25 MG tablet Take 25 mg by mouth daily.     No current facility-administered medications for this visit.    Allergies:   Patient has no known allergies.    Social History:   reports that she has been smoking cigarettes. She has a 5.00 pack-year smoking history. She has never used smokeless tobacco. She reports that she does not drink alcohol and does not use drugs.   Family History:  family history includes Alcoholism in her brother; Diabetes in her father; Heart failure in her father; Hypertension in her brother, brother, brother, and sister; Leukemia in her sister; Liver cancer in her mother.    ROS:     Review of Systems  Constitutional:  Positive for malaise/fatigue.  HENT: Negative.    Eyes: Negative.   Respiratory: Negative.    Cardiovascular: Negative.   Gastrointestinal: Negative.   Genitourinary: Negative.   Musculoskeletal: Negative.   Skin: Negative.   Neurological: Negative.   Endo/Heme/Allergies: Negative.   Psychiatric/Behavioral: Negative.  All other systems reviewed and are negative.   All other systems are reviewed and negative.   PHYSICAL EXAM: VS:  BP (!) 140/80   Pulse (!) 58   Ht 5\' 4"  (1.626 m)   Wt 235 lb 9.6 oz (106.9 kg)   LMP  (LMP Unknown)   SpO2 99%   BMI 40.44 kg/m  , BMI Body mass index is 40.44 kg/m. Last weight:  Wt Readings from Last 3 Encounters:  06/21/22 235 lb 9.6 oz (106.9 kg)  05/28/22 233 lb 12.8 oz (106.1 kg)  12/03/20 215 lb (97.5 kg)   Physical Exam Constitutional:      Appearance: Normal appearance.  Cardiovascular:     Rate and Rhythm: Normal rate and regular rhythm.     Heart sounds: Normal heart sounds.  Pulmonary:     Effort: Pulmonary effort is normal.     Breath sounds: Normal breath sounds.   Musculoskeletal:     Right lower leg: No edema.     Left lower leg: No edema.  Neurological:     Mental Status: She is alert.     EKG: none today  Recent Labs: No results found for requested labs within last 365 days.    Lipid Panel No results found for: "CHOL", "TRIG", "HDL", "CHOLHDL", "VLDL", "LDLCALC", "LDLDIRECT"    Other studies Reviewed: Additional studies/ records that were reviewed today include:  Review of the above records demonstrates:      11/29/2016    9:38 AM 05/16/2016    1:27 PM  PAD Screen  Previous PAD dx? No No  Previous surgical procedure? No No  Pain with walking? No No  Feet/toe relief with dangling? Yes Yes  Painful, non-healing ulcers? No No  Extremities discolored? No No   ASSESSMENT AND PLAN:    ICD-10-CM   1. Nonrheumatic aortic valve insufficiency  I35.1     2. Chronic diastolic heart failure (HCC)  I50.32     3. Mixed hyperlipidemia  E78.2     4. Nonrheumatic mitral valve regurgitation  I34.0     5. Fatigue, unspecified type  R53.83 CBC with Differential/Platelet       Problem List Items Addressed This Visit       Cardiovascular and Mediastinum   Chronic diastolic heart failure (HCC) (Chronic)    Shortness of breath and cough improved since starting spironolactone.       Aortic insufficiency - Primary (Chronic)    Echo 05/2022 mod-severe aortic regurgitation. Will continue to monitor.       Nonrheumatic mitral valve regurgitation    Echo 05/2022 moderate mitral regurgiation        Other   Mixed hyperlipidemia   Fatigue    Patient complaining of feeling fatigued. Will order CBC to check for anemia. TSH normal in 03/2022. The patient is asked to make an attempt to improve diet and exercise patterns to aid in medical management of this problem.      Relevant Orders   CBC with Differential/Platelet     Disposition:   Return in about 3 months (around 09/21/2022).    Total time spent: 30 minutes  Signed,  Neoma Laming, MD  06/21/2022 [place]

## 2022-06-21 NOTE — Assessment & Plan Note (Signed)
Patient complaining of feeling fatigued. Will order CBC to check for anemia. TSH normal in 03/2022. The patient is asked to make an attempt to improve diet and exercise patterns to aid in medical management of this problem.

## 2022-06-21 NOTE — Assessment & Plan Note (Signed)
Shortness of breath and cough improved since starting spironolactone.

## 2022-06-27 ENCOUNTER — Other Ambulatory Visit: Payer: Self-pay | Admitting: Cardiovascular Disease

## 2022-06-27 DIAGNOSIS — I5032 Chronic diastolic (congestive) heart failure: Secondary | ICD-10-CM

## 2022-07-27 ENCOUNTER — Other Ambulatory Visit: Payer: Self-pay | Admitting: Nurse Practitioner

## 2022-07-27 ENCOUNTER — Other Ambulatory Visit: Payer: 59

## 2022-07-27 DIAGNOSIS — E785 Hyperlipidemia, unspecified: Secondary | ICD-10-CM | POA: Diagnosis not present

## 2022-07-27 DIAGNOSIS — N189 Chronic kidney disease, unspecified: Secondary | ICD-10-CM | POA: Diagnosis not present

## 2022-07-27 DIAGNOSIS — R7303 Prediabetes: Secondary | ICD-10-CM | POA: Diagnosis not present

## 2022-07-27 DIAGNOSIS — I10 Essential (primary) hypertension: Secondary | ICD-10-CM | POA: Diagnosis not present

## 2022-07-28 LAB — CBC WITH DIFFERENTIAL/PLATELET
Basophils Absolute: 0.1 10*3/uL (ref 0.0–0.2)
Basos: 1 %
EOS (ABSOLUTE): 0.3 10*3/uL (ref 0.0–0.4)
Eos: 4 %
Hematocrit: 43.1 % (ref 34.0–46.6)
Hemoglobin: 13.8 g/dL (ref 11.1–15.9)
Immature Grans (Abs): 0 10*3/uL (ref 0.0–0.1)
Immature Granulocytes: 0 %
Lymphocytes Absolute: 1.6 10*3/uL (ref 0.7–3.1)
Lymphs: 20 %
MCH: 28.8 pg (ref 26.6–33.0)
MCHC: 32 g/dL (ref 31.5–35.7)
MCV: 90 fL (ref 79–97)
Monocytes Absolute: 0.8 10*3/uL (ref 0.1–0.9)
Monocytes: 10 %
Neutrophils Absolute: 5.1 10*3/uL (ref 1.4–7.0)
Neutrophils: 65 %
Platelets: 198 10*3/uL (ref 150–450)
RBC: 4.8 x10E6/uL (ref 3.77–5.28)
RDW: 15.1 % (ref 11.7–15.4)
WBC: 7.9 10*3/uL (ref 3.4–10.8)

## 2022-07-28 LAB — COMPREHENSIVE METABOLIC PANEL
ALT: 11 IU/L (ref 0–32)
AST: 16 IU/L (ref 0–40)
Albumin/Globulin Ratio: 1.1 — ABNORMAL LOW (ref 1.2–2.2)
Albumin: 3.9 g/dL (ref 3.9–4.9)
Alkaline Phosphatase: 124 IU/L — ABNORMAL HIGH (ref 44–121)
BUN/Creatinine Ratio: 9 — ABNORMAL LOW (ref 12–28)
BUN: 12 mg/dL (ref 8–27)
Bilirubin Total: 0.6 mg/dL (ref 0.0–1.2)
CO2: 23 mmol/L (ref 20–29)
Calcium: 9.9 mg/dL (ref 8.7–10.3)
Chloride: 102 mmol/L (ref 96–106)
Creatinine, Ser: 1.31 mg/dL — ABNORMAL HIGH (ref 0.57–1.00)
Globulin, Total: 3.5 g/dL (ref 1.5–4.5)
Glucose: 90 mg/dL (ref 70–99)
Potassium: 4.7 mmol/L (ref 3.5–5.2)
Sodium: 140 mmol/L (ref 134–144)
Total Protein: 7.4 g/dL (ref 6.0–8.5)
eGFR: 45 mL/min/{1.73_m2} — ABNORMAL LOW (ref >59)

## 2022-07-28 LAB — LIPID PANEL W/O CHOL/HDL RATIO
Cholesterol, Total: 132 mg/dL (ref 100–199)
HDL: 64 mg/dL (ref >39)
LDL Chol Calc (NIH): 56 mg/dL (ref 0–99)
Triglycerides: 54 mg/dL (ref 0–149)
VLDL Cholesterol Cal: 12 mg/dL (ref 5–40)

## 2022-07-28 LAB — HGB A1C W/O EAG: Hgb A1c MFr Bld: 6.2 % — ABNORMAL HIGH (ref 4.8–5.6)

## 2022-07-28 LAB — TSH: TSH: 1.98 u[IU]/mL (ref 0.450–4.500)

## 2022-08-02 ENCOUNTER — Ambulatory Visit (INDEPENDENT_AMBULATORY_CARE_PROVIDER_SITE_OTHER): Payer: 59 | Admitting: Nurse Practitioner

## 2022-08-02 ENCOUNTER — Encounter: Payer: Self-pay | Admitting: Nurse Practitioner

## 2022-08-02 ENCOUNTER — Other Ambulatory Visit: Payer: Self-pay | Admitting: Nurse Practitioner

## 2022-08-02 VITALS — BP 124/84 | HR 65 | Ht 60.0 in | Wt 232.0 lb

## 2022-08-02 DIAGNOSIS — E782 Mixed hyperlipidemia: Secondary | ICD-10-CM | POA: Diagnosis not present

## 2022-08-02 DIAGNOSIS — R7303 Prediabetes: Secondary | ICD-10-CM | POA: Diagnosis not present

## 2022-08-02 DIAGNOSIS — E669 Obesity, unspecified: Secondary | ICD-10-CM | POA: Diagnosis not present

## 2022-08-02 DIAGNOSIS — Z720 Tobacco use: Secondary | ICD-10-CM | POA: Diagnosis not present

## 2022-08-02 MED ORDER — PHENTERMINE HCL 37.5 MG PO TABS: 37.5000 mg | ORAL_TABLET | Freq: Every day | ORAL | 2 refills | Status: DC

## 2022-08-02 NOTE — Progress Notes (Signed)
Established Patient Office Visit  Subjective:  Patient ID: Sarah Mack, female    DOB: 05/10/1955  Age: 67 y.o. MRN: [id number]  Chief Complaint  Patient presents with   Follow-up    4 month follow up, discuss lab results.    4 month follow up, labs show prediabetes, and LDL is at goal.  She wants to lose weight to help with knee pain.      No other concerns at this time.   Past Medical History:  Diagnosis Date   (HFpEF) heart failure with preserved ejection fraction (HCC)    a. 10/2015 Echo: EF 55-60%, no rwma, mild to mod AI, nl RV fxn.   Aortic insufficiency    a. 10/2015 Echo: Mild to mod AI.   GERD (gastroesophageal reflux disease)    History of stress test    a. 11/2016 Echo: Small region of mild fixed apical thinning. No ischemia/infarct. Low risk.   Hypertension    Leaky heart valve     Past Surgical History:  Procedure Laterality Date   COLONOSCOPY WITH PROPOFOL N/A 08/06/2017   Procedure: COLONOSCOPY WITH PROPOFOL;  Surgeon: Midge Minium, MD;  Location: [facility] ENDOSCOPY;  Service: Endoscopy;  Laterality: N/A;   COLONOSCOPY WITH PROPOFOL N/A 03/09/2019   Procedure: COLONOSCOPY WITH PROPOFOL;  Surgeon: Toledo, Boykin Nearing, MD;  Location: [facility] ENDOSCOPY;  Service: Gastroenterology;  Laterality: N/A;   NO PAST SURGERIES      Social History   Socioeconomic History   Marital status: Legally Separated    Spouse name: Not on file   Number of children: Not on file   Years of education: Not on file   Highest education level: Not on file  Occupational History   Not on file  Tobacco Use   Smoking status: Every Day    Packs/day: 0.50    Years: 10.00    Additional pack years: 0.00    Total pack years: 5.00    Types: Cigarettes   Smokeless tobacco: Never  Vaping Use   Vaping Use: Never used  Substance and Sexual Activity   Alcohol use: No   Drug use: No   Sexual activity: Not on file  Other Topics Concern   Not on file  Social History Narrative   Not on file    Social Determinants of Health   Financial Resource Strain: Not on file  Food Insecurity: Not on file  Transportation Needs: Not on file  Physical Activity: Not on file  Stress: Not on file  Social Connections: Not on file  Intimate Partner Violence: Not on file    Family History  Problem Relation Age of Onset   Liver cancer Mother    Heart failure Father    Diabetes Father    Hypertension Sister    Hypertension Brother    Leukemia Sister    Hypertension Brother    Alcoholism Brother    Hypertension Brother    Breast cancer Neg Hx     No Known Allergies  Review of Systems  Constitutional: Negative.   HENT: Negative.    Eyes: Negative.   Respiratory: Negative.    Cardiovascular: Negative.   Gastrointestinal: Negative.   Genitourinary: Negative.   Musculoskeletal:  Positive for joint pain.  Skin: Negative.   Neurological: Negative.   Endo/Heme/Allergies: Negative.   Psychiatric/Behavioral:  Positive for memory loss.        Objective:   LMP  (LMP Unknown)   There were no vitals filed for this visit.  Physical  Exam Vitals reviewed.  Constitutional:      Appearance: Normal appearance.  HENT:     Head: Normocephalic.     Nose: Nose normal.     Mouth/Throat:     Mouth: Mucous membranes are moist.  Eyes:     Pupils: Pupils are equal, round, and reactive to light.  Cardiovascular:     Rate and Rhythm: Normal rate and regular rhythm.  Pulmonary:     Effort: Pulmonary effort is normal.     Breath sounds: Normal breath sounds.  Abdominal:     General: Bowel sounds are normal.     Palpations: Abdomen is soft.  Musculoskeletal:        General: Tenderness present.     Cervical back: Normal range of motion and neck supple.  Skin:    General: Skin is warm and dry.  Neurological:     Mental Status: She is alert. Mental status is at baseline.  Psychiatric:        Mood and Affect: Mood normal.        Behavior: Behavior normal.      No results found  for any visits on 08/02/22.  Recent Results (from the past 2160 hour(s))  CBC with Differential/Platelet     Status: None   Collection Time: 07/27/22  8:35 AM  Result Value Ref Range   WBC 7.9 3.4 - 10.8 x10E3/uL   RBC 4.80 3.77 - 5.28 x10E6/uL   Hemoglobin 13.8 11.1 - 15.9 g/dL   Hematocrit 81.1 91.4 - 46.6 %   MCV 90 79 - 97 fL   MCH 28.8 26.6 - 33.0 pg   MCHC 32.0 31.5 - 35.7 g/dL   RDW 78.2 95.6 - 21.3 %   Platelets 198 150 - 450 x10E3/uL   Neutrophils 65 Not Estab. %   Lymphs 20 Not Estab. %   Monocytes 10 Not Estab. %   Eos 4 Not Estab. %   Basos 1 Not Estab. %   Neutrophils Absolute 5.1 1.4 - 7.0 x10E3/uL   Lymphocytes Absolute 1.6 0.7 - 3.1 x10E3/uL   Monocytes Absolute 0.8 0.1 - 0.9 x10E3/uL   EOS (ABSOLUTE) 0.3 0.0 - 0.4 x10E3/uL   Basophils Absolute 0.1 0.0 - 0.2 x10E3/uL   Immature Granulocytes 0 Not Estab. %   Immature Grans (Abs) 0.0 0.0 - 0.1 x10E3/uL  Comprehensive metabolic panel     Status: Abnormal   Collection Time: 07/27/22  8:35 AM  Result Value Ref Range   Glucose 90 70 - 99 mg/dL   BUN 12 8 - 27 mg/dL   Creatinine, Ser 0.86 (H) 0.57 - 1.00 mg/dL   eGFR 45 (L) >57 QI/ONG/2.95   BUN/Creatinine Ratio 9 (L) 12 - 28   Sodium 140 134 - 144 mmol/L   Potassium 4.7 3.5 - 5.2 mmol/L   Chloride 102 96 - 106 mmol/L   CO2 23 20 - 29 mmol/L   Calcium 9.9 8.7 - 10.3 mg/dL   Total Protein 7.4 6.0 - 8.5 g/dL   Albumin 3.9 3.9 - 4.9 g/dL   Globulin, Total 3.5 1.5 - 4.5 g/dL   Albumin/Globulin Ratio 1.1 (L) 1.2 - 2.2   Bilirubin Total 0.6 0.0 - 1.2 mg/dL   Alkaline Phosphatase 124 (H) 44 - 121 IU/L   AST 16 0 - 40 IU/L   ALT 11 0 - 32 IU/L  Lipid Panel w/o Chol/HDL Ratio     Status: None   Collection Time: 07/27/22  8:35 AM  Result Value Ref  Range   Cholesterol, Total 132 100 - 199 mg/dL   Triglycerides 54 0 - 149 mg/dL   HDL 64 >40 mg/dL   VLDL Cholesterol Cal 12 5 - 40 mg/dL   LDL Chol Calc (NIH) 56 0 - 99 mg/dL  Hgb J8J w/o eAG     Status: Abnormal    Collection Time: 07/27/22  8:35 AM  Result Value Ref Range   Hgb A1c MFr Bld 6.2 (H) 4.8 - 5.6 %    Comment:          Prediabetes: 5.7 - 6.4          Diabetes: >6.4          Glycemic control for adults with diabetes: <7.0   TSH     Status: None   Collection Time: 07/27/22  8:35 AM  Result Value Ref Range   TSH 1.980 0.450 - 4.500 uIU/mL      Assessment & Plan:   Problem List Items Addressed This Visit   None   No follow-ups on file.   Total time spent: 35 minutes  Orson Eva, NP  08/02/2022

## 2022-08-02 NOTE — Patient Instructions (Addendum)
1) Phentermine 2) Gave pt two samples of Trelegy 3) Follow up appt in 6 months, fasting labs prior

## 2022-08-06 ENCOUNTER — Ambulatory Visit
Admission: RE | Admit: 2022-08-06 | Discharge: 2022-08-06 | Disposition: A | Discharge status: Home / Self Care | Payer: 59 | Source: Ambulatory Visit | Attending: Nurse Practitioner | Admitting: Nurse Practitioner

## 2022-08-06 DIAGNOSIS — Z1231 Encounter for screening mammogram for malignant neoplasm of breast: Secondary | ICD-10-CM | POA: Diagnosis not present

## 2022-09-19 ENCOUNTER — Other Ambulatory Visit: Payer: Self-pay | Admitting: Nurse Practitioner

## 2022-09-19 DIAGNOSIS — J309 Allergic rhinitis, unspecified: Secondary | ICD-10-CM

## 2022-09-21 ENCOUNTER — Ambulatory Visit: Payer: 59 | Admitting: Cardiovascular Disease

## 2022-10-01 ENCOUNTER — Ambulatory Visit (INDEPENDENT_AMBULATORY_CARE_PROVIDER_SITE_OTHER): Payer: 59 | Admitting: Cardiovascular Disease

## 2022-10-01 ENCOUNTER — Encounter: Payer: Self-pay | Admitting: Cardiovascular Disease

## 2022-10-01 VITALS — BP 138/98 | HR 63 | Ht <= 58 in | Wt 220.2 lb

## 2022-10-01 DIAGNOSIS — I10 Essential (primary) hypertension: Secondary | ICD-10-CM

## 2022-10-01 DIAGNOSIS — R0602 Shortness of breath: Secondary | ICD-10-CM

## 2022-10-01 DIAGNOSIS — Z720 Tobacco use: Secondary | ICD-10-CM | POA: Diagnosis not present

## 2022-10-01 DIAGNOSIS — I340 Nonrheumatic mitral (valve) insufficiency: Secondary | ICD-10-CM

## 2022-10-01 DIAGNOSIS — I5032 Chronic diastolic (congestive) heart failure: Secondary | ICD-10-CM | POA: Diagnosis not present

## 2022-10-01 DIAGNOSIS — I351 Nonrheumatic aortic (valve) insufficiency: Secondary | ICD-10-CM | POA: Diagnosis not present

## 2022-10-01 DIAGNOSIS — E782 Mixed hyperlipidemia: Secondary | ICD-10-CM

## 2022-10-01 DIAGNOSIS — R42 Dizziness and giddiness: Secondary | ICD-10-CM | POA: Diagnosis not present

## 2022-10-01 MED ORDER — SPIRONOLACTONE 25 MG PO TABS: 25.0000 mg | ORAL_TABLET | Freq: Every day | ORAL | 0 refills | Status: DC

## 2022-10-01 MED ORDER — AMLODIPINE BESYLATE 10 MG PO TABS: 10.0000 mg | ORAL_TABLET | Freq: Every day | ORAL | 11 refills | Status: DC

## 2022-10-01 MED ORDER — RAMIPRIL 10 MG PO CAPS: 10.0000 mg | ORAL_CAPSULE | Freq: Every morning | ORAL | 2 refills | Status: DC

## 2022-10-01 NOTE — Progress Notes (Signed)
Cardiology Office Note   Date:  10/01/2022   ID:  MAYLEI ZEBROWSKI, DOB 1955/05/31, MRN [id number]  PCP:  Orson Eva, NP  Cardiologist:  Adrian Blackwater, MD      History of Present Illness: Sarah Mack is a 67 y.o. female who presents for  Chief Complaint  Patient presents with   Follow-up    3 months follow up    BP is high, Had dizzy spell Saturday but was hot while sitting out      Past Medical History:  Diagnosis Date   (HFpEF) heart failure with preserved ejection fraction (HCC)    a. 10/2015 Echo: EF 55-60%, no rwma, mild to mod AI, nl RV fxn.   Aortic insufficiency    a. 10/2015 Echo: Mild to mod AI.   GERD (gastroesophageal reflux disease)    History of stress test    a. 11/2016 Echo: Small region of mild fixed apical thinning. No ischemia/infarct. Low risk.   Hypertension    Leaky heart valve      Past Surgical History:  Procedure Laterality Date   COLONOSCOPY WITH PROPOFOL N/A 08/06/2017   Procedure: COLONOSCOPY WITH PROPOFOL;  Surgeon: Midge Minium, MD;  Location: [facility] ENDOSCOPY;  Service: Endoscopy;  Laterality: N/A;   COLONOSCOPY WITH PROPOFOL N/A 03/09/2019   Procedure: COLONOSCOPY WITH PROPOFOL;  Surgeon: Toledo, Boykin Nearing, MD;  Location: [facility] ENDOSCOPY;  Service: Gastroenterology;  Laterality: N/A;   NO PAST SURGERIES       Current Outpatient Medications  Medication Sig Dispense Refill   amLODipine (NORVASC) 10 MG tablet Take 1 tablet (10 mg total) by mouth daily. 30 tablet 11   phentermine (ADIPEX-P) 37.5 MG tablet Take 1 tablet (37.5 mg total) by mouth daily before breakfast. 30 tablet 2   albuterol (VENTOLIN HFA) 108 (90 Base) MCG/ACT inhaler Inhale 1 puff into the lungs as needed for wheezing or shortness of breath.     Aspirin 81 MG CAPS Take 1 capsule by mouth daily.     celecoxib (CELEBREX) 200 MG capsule Take 200 mg by mouth 2 (two) times daily.     cetirizine (ZYRTEC) 10 MG tablet TAKE 1 TABLET BY MOUTH EVERYDAY AT BEDTIME 90 tablet  3   Cholecalciferol (VITAMIN D3) 25 MCG (1000 UT) CAPS Take 1 capsule by mouth daily.     loratadine (CLARITIN) 10 MG tablet Take 10 mg by mouth every morning.     metoprolol succinate (TOPROL-XL) 50 MG 24 hr tablet TAKE 1 TABLET BY MOUTH NIGHTLY AT BEDTIME FOR BLOOD PRESSURE 90 tablet 3   montelukast (SINGULAIR) 10 MG tablet TAKE 1 TABLET BY MOUTH EVERYDAY AT BEDTIME 90 tablet 3   ramipril (ALTACE) 10 MG capsule Take 1 capsule (10 mg total) by mouth every morning. 90 capsule 2   rosuvastatin (CRESTOR) 20 MG tablet Take 20 mg by mouth daily.     spironolactone (ALDACTONE) 25 MG tablet Take 1 tablet (25 mg total) by mouth daily. 90 tablet 0   No current facility-administered medications for this visit.    Allergies:   Patient has no known allergies.    Social History:   reports that she has been smoking cigarettes. She has a 5.00 pack-year smoking history. She has never used smokeless tobacco. She reports that she does not drink alcohol and does not use drugs.   Family History:  family history includes Alcoholism in her brother; Diabetes in her father; Heart failure in her father; Hypertension in her brother, brother, brother, and  sister; Leukemia in her sister; Liver cancer in her mother.    ROS:     Review of Systems  Constitutional: Negative.   HENT: Negative.    Eyes: Negative.   Respiratory: Negative.    Gastrointestinal: Negative.   Genitourinary: Negative.   Musculoskeletal: Negative.   Skin: Negative.   Neurological: Negative.   Endo/Heme/Allergies: Negative.   Psychiatric/Behavioral: Negative.    All other systems reviewed and are negative.     All other systems are reviewed and negative.    PHYSICAL EXAM: VS:  BP (!) 138/98   Pulse 63   Ht 4\' 10"  (1.473 m)   Wt 220 lb 3.2 oz (99.9 kg)   LMP  (LMP Unknown)   SpO2 97%   BMI 46.02 kg/m  , BMI Body mass index is 46.02 kg/m. Last weight:  Wt Readings from Last 3 Encounters:  10/01/22 220 lb 3.2 oz (99.9 kg)   08/02/22 232 lb (105.2 kg)  06/21/22 235 lb 9.6 oz (106.9 kg)     Physical Exam Constitutional:      Appearance: Normal appearance.  Cardiovascular:     Rate and Rhythm: Normal rate and regular rhythm.     Heart sounds: Normal heart sounds.  Pulmonary:     Effort: Pulmonary effort is normal.     Breath sounds: Normal breath sounds.  Musculoskeletal:     Right lower leg: No edema.     Left lower leg: No edema.  Neurological:     Mental Status: She is alert.       EKG:   Recent Labs: 07/27/2022: ALT 11; BUN 12; Creatinine, Ser 1.31; Hemoglobin 13.8; Platelets 198; Potassium 4.7; Sodium 140; TSH 1.980    Lipid Panel    Component Value Date/Time   CHOL 132 07/27/2022 0835   TRIG 54 07/27/2022 0835   HDL 64 07/27/2022 0835   LDLCALC 56 07/27/2022 0835      Other studies Reviewed: Additional studies/ records that were reviewed today include:  Review of the above records demonstrates:      11/29/2016    9:38 AM 05/16/2016    1:27 PM  PAD Screen  Previous PAD dx? No No  Previous surgical procedure? No No  Pain with walking? No No  Feet/toe relief with dangling? Yes Yes  Painful, non-healing ulcers? No No  Extremities discolored? No No      ASSESSMENT AND PLAN:    ICD-10-CM   1. Nonrheumatic aortic valve insufficiency  I35.1 amLODipine (NORVASC) 10 MG tablet   moderate to severe    2. Chronic diastolic heart failure (HCC)  Z61.09 amLODipine (NORVASC) 10 MG tablet    spironolactone (ALDACTONE) 25 MG tablet    3. Nonrheumatic mitral valve regurgitation  I34.0 amLODipine (NORVASC) 10 MG tablet   moderate    4. Mixed hyperlipidemia  E78.2 amLODipine (NORVASC) 10 MG tablet    5. Tobacco use  Z72.0 amLODipine (NORVASC) 10 MG tablet    6. Dizziness  R42 amLODipine (NORVASC) 10 MG tablet    7. SOB (shortness of breath)  R06.02 amLODipine (NORVASC) 10 MG tablet   ccta showed normal coronaries    8. Primary hypertension  I10 amLODipine (NORVASC) 10 MG tablet    will adjust medication as bp HIGH, ADD AMLODAPINE 10 DAILY. labs CREA1.23 GFR LOW SIDE       Problem List Items Addressed This Visit       Cardiovascular and Mediastinum   Chronic diastolic heart failure (HCC) (Chronic)   Relevant  Medications   amLODipine (NORVASC) 10 MG tablet   spironolactone (ALDACTONE) 25 MG tablet   ramipril (ALTACE) 10 MG capsule   Aortic insufficiency - Primary (Chronic)   Relevant Medications   amLODipine (NORVASC) 10 MG tablet   spironolactone (ALDACTONE) 25 MG tablet   ramipril (ALTACE) 10 MG capsule   Nonrheumatic mitral valve regurgitation   Relevant Medications   amLODipine (NORVASC) 10 MG tablet   spironolactone (ALDACTONE) 25 MG tablet   ramipril (ALTACE) 10 MG capsule     Other   Tobacco use (Chronic)   Relevant Medications   amLODipine (NORVASC) 10 MG tablet   Mixed hyperlipidemia   Relevant Medications   amLODipine (NORVASC) 10 MG tablet   spironolactone (ALDACTONE) 25 MG tablet   ramipril (ALTACE) 10 MG capsule   Other Visit Diagnoses     Dizziness       Relevant Medications   amLODipine (NORVASC) 10 MG tablet   SOB (shortness of breath)       ccta showed normal coronaries   Relevant Medications   amLODipine (NORVASC) 10 MG tablet   Primary hypertension       will adjust medication as bp HIGH, ADD AMLODAPINE 10 DAILY. labs CREA1.23 GFR LOW SIDE   Relevant Medications   amLODipine (NORVASC) 10 MG tablet   spironolactone (ALDACTONE) 25 MG tablet   ramipril (ALTACE) 10 MG capsule          Disposition:   Return in about 6 weeks (around 11/12/2022).    Total time spent: 30 minutes  Signed,  Adrian Blackwater, MD  10/01/2022 10:17 AM    [facility]

## 2022-11-13 ENCOUNTER — Ambulatory Visit (INDEPENDENT_AMBULATORY_CARE_PROVIDER_SITE_OTHER): Payer: 59 | Admitting: Cardiovascular Disease

## 2022-11-13 ENCOUNTER — Encounter: Payer: Self-pay | Admitting: Cardiovascular Disease

## 2022-11-13 VITALS — BP 130/65 | HR 80 | Ht <= 58 in | Wt 230.0 lb

## 2022-11-13 DIAGNOSIS — I5032 Chronic diastolic (congestive) heart failure: Secondary | ICD-10-CM

## 2022-11-13 DIAGNOSIS — R0602 Shortness of breath: Secondary | ICD-10-CM | POA: Diagnosis not present

## 2022-11-13 DIAGNOSIS — E782 Mixed hyperlipidemia: Secondary | ICD-10-CM | POA: Diagnosis not present

## 2022-11-13 DIAGNOSIS — R000 Tachycardia, unspecified: Secondary | ICD-10-CM | POA: Diagnosis not present

## 2022-11-13 DIAGNOSIS — Z720 Tobacco use: Secondary | ICD-10-CM

## 2022-11-13 DIAGNOSIS — I351 Nonrheumatic aortic (valve) insufficiency: Secondary | ICD-10-CM

## 2022-11-13 DIAGNOSIS — R42 Dizziness and giddiness: Secondary | ICD-10-CM | POA: Diagnosis not present

## 2022-11-13 DIAGNOSIS — I340 Nonrheumatic mitral (valve) insufficiency: Secondary | ICD-10-CM | POA: Diagnosis not present

## 2022-11-13 DIAGNOSIS — I10 Essential (primary) hypertension: Secondary | ICD-10-CM | POA: Diagnosis not present

## 2022-11-13 MED ORDER — AMLODIPINE BESYLATE 5 MG PO TABS: 5.0000 mg | ORAL_TABLET | Freq: Every day | ORAL | 11 refills | Status: DC

## 2022-11-13 NOTE — Progress Notes (Signed)
Cardiology Office Note   Date:  11/13/2022   ID:  Bob, Chhay 02-Jul-1955, MRN [id number]  PCP:  Orson Eva, NP (Inactive)  Cardiologist:  Adrian Blackwater, MD      History of Present Illness: Sarah Mack is a 67 y.o. female who presents for  Chief Complaint  Patient presents with   Follow-up    6 week     Feels weak and stopped taking new medication as BP dropped to 100.      Past Medical History:  Diagnosis Date   (HFpEF) heart failure with preserved ejection fraction (HCC)    a. 10/2015 Echo: EF 55-60%, no rwma, mild to mod AI, nl RV fxn.   Aortic insufficiency    a. 10/2015 Echo: Mild to mod AI.   GERD (gastroesophageal reflux disease)    History of stress test    a. 11/2016 Echo: Small region of mild fixed apical thinning. No ischemia/infarct. Low risk.   Hypertension    Leaky heart valve      Past Surgical History:  Procedure Laterality Date   COLONOSCOPY WITH PROPOFOL N/A 08/06/2017   Procedure: COLONOSCOPY WITH PROPOFOL;  Surgeon: Midge Minium, MD;  Location: [facility] ENDOSCOPY;  Service: Endoscopy;  Laterality: N/A;   COLONOSCOPY WITH PROPOFOL N/A 03/09/2019   Procedure: COLONOSCOPY WITH PROPOFOL;  Surgeon: Toledo, Boykin Nearing, MD;  Location: [facility] ENDOSCOPY;  Service: Gastroenterology;  Laterality: N/A;   NO PAST SURGERIES       Current Outpatient Medications  Medication Sig Dispense Refill   amLODipine (NORVASC) 5 MG tablet Take 1 tablet (5 mg total) by mouth daily. 30 tablet 11   phentermine (ADIPEX-P) 37.5 MG tablet Take 1 tablet (37.5 mg total) by mouth daily before breakfast. 30 tablet 2   albuterol (VENTOLIN HFA) 108 (90 Base) MCG/ACT inhaler Inhale 1 puff into the lungs as needed for wheezing or shortness of breath.     Aspirin 81 MG CAPS Take 1 capsule by mouth daily.     celecoxib (CELEBREX) 200 MG capsule Take 200 mg by mouth 2 (two) times daily.     cetirizine (ZYRTEC) 10 MG tablet TAKE 1 TABLET BY MOUTH EVERYDAY AT BEDTIME 90 tablet 3    Cholecalciferol (VITAMIN D3) 25 MCG (1000 UT) CAPS Take 1 capsule by mouth daily.     loratadine (CLARITIN) 10 MG tablet Take 10 mg by mouth every morning.     metoprolol succinate (TOPROL-XL) 50 MG 24 hr tablet TAKE 1 TABLET BY MOUTH NIGHTLY AT BEDTIME FOR BLOOD PRESSURE 90 tablet 3   montelukast (SINGULAIR) 10 MG tablet TAKE 1 TABLET BY MOUTH EVERYDAY AT BEDTIME 90 tablet 3   ramipril (ALTACE) 10 MG capsule Take 1 capsule (10 mg total) by mouth every morning. 90 capsule 2   rosuvastatin (CRESTOR) 20 MG tablet Take 20 mg by mouth daily.     spironolactone (ALDACTONE) 25 MG tablet Take 1 tablet (25 mg total) by mouth daily. 90 tablet 0   No current facility-administered medications for this visit.    Allergies:   Patient has no known allergies.    Social History:   reports that she has been smoking cigarettes. She has a 5 pack-year smoking history. She has never used smokeless tobacco. She reports that she does not drink alcohol and does not use drugs.   Family History:  family history includes Alcoholism in her brother; Diabetes in her father; Heart failure in her father; Hypertension in her brother, brother, brother, and sister;  Leukemia in her sister; Liver cancer in her mother.    ROS:     Review of Systems  Constitutional: Negative.   HENT: Negative.    Eyes: Negative.   Respiratory: Negative.    Gastrointestinal: Negative.   Genitourinary: Negative.   Musculoskeletal: Negative.   Skin: Negative.   Neurological: Negative.   Endo/Heme/Allergies: Negative.   Psychiatric/Behavioral: Negative.    All other systems reviewed and are negative.     All other systems are reviewed and negative.    PHYSICAL EXAM: VS:  BP 130/65   Pulse 80   Ht 4\' 10"  (1.473 m)   Wt 230 lb (104.3 kg)   LMP  (LMP Unknown)   SpO2 93%   BMI 48.07 kg/m  , BMI Body mass index is 48.07 kg/m. Last weight:  Wt Readings from Last 3 Encounters:  11/13/22 230 lb (104.3 kg)  10/01/22 220 lb 3.2 oz  (99.9 kg)  08/02/22 232 lb (105.2 kg)     Physical Exam Constitutional:      Appearance: Normal appearance.  Cardiovascular:     Rate and Rhythm: Normal rate and regular rhythm.     Heart sounds: Normal heart sounds.  Pulmonary:     Effort: Pulmonary effort is normal.     Breath sounds: Normal breath sounds.  Musculoskeletal:     Right lower leg: No edema.     Left lower leg: No edema.  Neurological:     Mental Status: She is alert.       EKG:   Recent Labs: 07/27/2022: ALT 11; BUN 12; Creatinine, Ser 1.31; Hemoglobin 13.8; Platelets 198; Potassium 4.7; Sodium 140; TSH 1.980    Lipid Panel    Component Value Date/Time   CHOL 132 07/27/2022 0835   TRIG 54 07/27/2022 0835   HDL 64 07/27/2022 0835   LDLCALC 56 07/27/2022 0835      Other studies Reviewed: Additional studies/ records that were reviewed today include:  Review of the above records demonstrates:      11/29/2016    9:38 AM 05/16/2016    1:27 PM  PAD Screen  Previous PAD dx? No No  Previous surgical procedure? No No  Pain with walking? No No  Feet/toe relief with dangling? Yes Yes  Painful, non-healing ulcers? No No  Extremities discolored? No No      ASSESSMENT AND PLAN:    ICD-10-CM   1. Nonrheumatic aortic valve insufficiency  I35.1 amLODipine (NORVASC) 5 MG tablet    2. Chronic diastolic heart failure (HCC)  Z61.09 amLODipine (NORVASC) 5 MG tablet    3. Nonrheumatic mitral valve regurgitation  I34.0 amLODipine (NORVASC) 5 MG tablet    4. Tobacco use  Z72.0 amLODipine (NORVASC) 5 MG tablet    5. Tachycardia  R00.0 amLODipine (NORVASC) 5 MG tablet    6. Mixed hyperlipidemia  E78.2 amLODipine (NORVASC) 5 MG tablet    7. Dizziness  R42 amLODipine (NORVASC) 5 MG tablet    8. SOB (shortness of breath)  R06.02 amLODipine (NORVASC) 5 MG tablet    9. Primary hypertension  I10 amLODipine (NORVASC) 5 MG tablet   BP diastolic still low, and amlodapine caused hypotension , thus may change to 5 mg  daily if Bp is high.       Problem List Items Addressed This Visit       Cardiovascular and Mediastinum   Chronic diastolic heart failure (HCC) (Chronic)   Relevant Medications   amLODipine (NORVASC) 5 MG tablet   Aortic  insufficiency - Primary (Chronic)   Relevant Medications   amLODipine (NORVASC) 5 MG tablet   Nonrheumatic mitral valve regurgitation   Relevant Medications   amLODipine (NORVASC) 5 MG tablet     Other   Tobacco use (Chronic)   Relevant Medications   amLODipine (NORVASC) 5 MG tablet   Tachycardia (Chronic)   Relevant Medications   amLODipine (NORVASC) 5 MG tablet   Mixed hyperlipidemia   Relevant Medications   amLODipine (NORVASC) 5 MG tablet   Other Visit Diagnoses     Dizziness       Relevant Medications   amLODipine (NORVASC) 5 MG tablet   SOB (shortness of breath)       Relevant Medications   amLODipine (NORVASC) 5 MG tablet   Primary hypertension       BP diastolic still low, and amlodapine caused hypotension , thus may change to 5 mg daily if Bp is high.   Relevant Medications   amLODipine (NORVASC) 5 MG tablet          Disposition:   Return in about 4 weeks (around 12/11/2022).    Total time spent: 30 minutes  Signed,  Adrian Blackwater, MD  11/13/2022 9:50 AM    [facility]

## 2022-11-22 DIAGNOSIS — H25813 Combined forms of age-related cataract, bilateral: Secondary | ICD-10-CM | POA: Diagnosis not present

## 2022-12-03 ENCOUNTER — Other Ambulatory Visit: Payer: Self-pay | Admitting: Nurse Practitioner

## 2022-12-04 MED ORDER — CELECOXIB 200 MG PO CAPS: 200.0000 mg | ORAL_CAPSULE | Freq: Two times a day (BID) | ORAL | 1 refills | Status: DC

## 2022-12-14 ENCOUNTER — Encounter: Payer: Self-pay | Admitting: Cardiovascular Disease

## 2022-12-14 ENCOUNTER — Ambulatory Visit (INDEPENDENT_AMBULATORY_CARE_PROVIDER_SITE_OTHER): Payer: 59 | Admitting: Cardiovascular Disease

## 2022-12-14 VITALS — BP 106/62 | HR 66 | Ht <= 58 in | Wt 229.0 lb

## 2022-12-14 DIAGNOSIS — I340 Nonrheumatic mitral (valve) insufficiency: Secondary | ICD-10-CM | POA: Diagnosis not present

## 2022-12-14 DIAGNOSIS — R0602 Shortness of breath: Secondary | ICD-10-CM | POA: Diagnosis not present

## 2022-12-14 DIAGNOSIS — I5032 Chronic diastolic (congestive) heart failure: Secondary | ICD-10-CM | POA: Diagnosis not present

## 2022-12-14 DIAGNOSIS — I10 Essential (primary) hypertension: Secondary | ICD-10-CM | POA: Diagnosis not present

## 2022-12-14 DIAGNOSIS — I351 Nonrheumatic aortic (valve) insufficiency: Secondary | ICD-10-CM | POA: Diagnosis not present

## 2022-12-14 DIAGNOSIS — F17210 Nicotine dependence, cigarettes, uncomplicated: Secondary | ICD-10-CM

## 2022-12-14 DIAGNOSIS — Z720 Tobacco use: Secondary | ICD-10-CM

## 2022-12-14 NOTE — Progress Notes (Signed)
Cardiology Office Note   Date:  12/14/2022   ID:  Kemisha, Odendahl [birth year], MRN [id number]  PCP:  Orson Eva, NP (Inactive)  Cardiologist:  Adrian Blackwater, MD      History of Present Illness: Sarah Mack is a 67 y.o. female who presents for  Chief Complaint  Patient presents with   Follow-up    1 Month Follow Up    Shortness of Breath The current episode started more than 1 year ago. The problem has been waxing and waning. Pertinent negatives include no chest pain. The symptoms are aggravated by exercise.      Past Medical History:  Diagnosis Date   (HFpEF) heart failure with preserved ejection fraction (HCC)    a. 10/2015 Echo: EF 55-60%, no rwma, mild to mod AI, nl RV fxn.   Aortic insufficiency    a. 10/2015 Echo: Mild to mod AI.   GERD (gastroesophageal reflux disease)    History of stress test    a. 11/2016 Echo: Small region of mild fixed apical thinning. No ischemia/infarct. Low risk.   Hypertension    Leaky heart valve      Past Surgical History:  Procedure Laterality Date   COLONOSCOPY WITH PROPOFOL N/A 08/06/2017   Procedure: COLONOSCOPY WITH PROPOFOL;  Surgeon: Midge Minium, MD;  Location: [facility] ENDOSCOPY;  Service: Endoscopy;  Laterality: N/A;   COLONOSCOPY WITH PROPOFOL N/A 03/09/2019   Procedure: COLONOSCOPY WITH PROPOFOL;  Surgeon: Toledo, Boykin Nearing, MD;  Location: [facility] ENDOSCOPY;  Service: Gastroenterology;  Laterality: N/A;   NO PAST SURGERIES       Current Outpatient Medications  Medication Sig Dispense Refill   phentermine (ADIPEX-P) 37.5 MG tablet Take 1 tablet (37.5 mg total) by mouth daily before breakfast. 30 tablet 2   albuterol (VENTOLIN HFA) 108 (90 Base) MCG/ACT inhaler Inhale 1 puff into the lungs as needed for wheezing or shortness of breath.     amLODipine (NORVASC) 5 MG tablet Take 1 tablet (5 mg total) by mouth daily. 30 tablet 11   Aspirin 81 MG CAPS Take 1 capsule by mouth daily.     celecoxib (CELEBREX) 200 MG  capsule Take 1 capsule (200 mg total) by mouth 2 (two) times daily. 60 capsule 1   cetirizine (ZYRTEC) 10 MG tablet TAKE 1 TABLET BY MOUTH EVERYDAY AT BEDTIME 90 tablet 3   Cholecalciferol (VITAMIN D3) 25 MCG (1000 UT) CAPS Take 1 capsule by mouth daily.     loratadine (CLARITIN) 10 MG tablet Take 10 mg by mouth every morning.     metoprolol succinate (TOPROL-XL) 50 MG 24 hr tablet TAKE 1 TABLET BY MOUTH NIGHTLY AT BEDTIME FOR BLOOD PRESSURE 90 tablet 3   montelukast (SINGULAIR) 10 MG tablet TAKE 1 TABLET BY MOUTH EVERYDAY AT BEDTIME 90 tablet 3   ramipril (ALTACE) 10 MG capsule Take 1 capsule (10 mg total) by mouth every morning. 90 capsule 2   rosuvastatin (CRESTOR) 20 MG tablet Take 20 mg by mouth daily.     spironolactone (ALDACTONE) 25 MG tablet Take 1 tablet (25 mg total) by mouth daily. 90 tablet 0   No current facility-administered medications for this visit.    Allergies:   Patient has no known allergies.    Social History:   reports that she has been smoking cigarettes. She has a 5 pack-year smoking history. She has never used smokeless tobacco. She reports that she does not drink alcohol and does not use drugs.   Family History:  family history includes Alcoholism in her brother; Diabetes in her father; Heart failure in her father; Hypertension in her brother, brother, brother, and sister; Leukemia in her sister; Liver cancer in her mother.    ROS:     Review of Systems  Constitutional: Negative.   HENT: Negative.    Eyes: Negative.   Respiratory:  Positive for shortness of breath.   Cardiovascular:  Negative for chest pain.  Gastrointestinal: Negative.   Genitourinary: Negative.   Musculoskeletal: Negative.   Skin: Negative.   Neurological: Negative.   Endo/Heme/Allergies: Negative.   Psychiatric/Behavioral: Negative.    All other systems reviewed and are negative.     All other systems are reviewed and negative.    PHYSICAL EXAM: VS:  BP 106/62   Pulse 66    Ht 4\' 10"  (1.473 m)   Wt 229 lb (103.9 kg)   LMP  (LMP Unknown)   SpO2 (!) 88%   BMI 47.86 kg/m  , BMI Body mass index is 47.86 kg/m. Last weight:  Wt Readings from Last 3 Encounters:  12/14/22 229 lb (103.9 kg)  11/13/22 230 lb (104.3 kg)  10/01/22 220 lb 3.2 oz (99.9 kg)     Physical Exam Constitutional:      Appearance: Normal appearance.  Cardiovascular:     Rate and Rhythm: Normal rate and regular rhythm.     Heart sounds: Normal heart sounds.  Pulmonary:     Effort: Pulmonary effort is normal.     Breath sounds: Normal breath sounds.  Musculoskeletal:     Right lower leg: No edema.     Left lower leg: No edema.  Neurological:     Mental Status: She is alert.       EKG:   Recent Labs: 07/27/2022: ALT 11; BUN 12; Creatinine, Ser 1.31; Hemoglobin 13.8; Platelets 198; Potassium 4.7; Sodium 140; TSH 1.980    Lipid Panel    Component Value Date/Time   CHOL 132 07/27/2022 0835   TRIG 54 07/27/2022 0835   HDL 64 07/27/2022 0835   LDLCALC 56 07/27/2022 0835      Other studies Reviewed: Additional studies/ records that were reviewed today include:  Review of the above records demonstrates:      11/29/2016    9:38 AM 05/16/2016    1:27 PM  PAD Screen  Previous PAD dx? No No  Previous surgical procedure? No No  Pain with walking? No No  Feet/toe relief with dangling? Yes Yes  Painful, non-healing ulcers? No No  Extremities discolored? No No      ASSESSMENT AND PLAN:    ICD-10-CM   1. Nonrheumatic aortic valve insufficiency  I35.1     2. Chronic diastolic heart failure (HCC)  R60.45     3. Nonrheumatic mitral valve regurgitation  I34.0     4. Tobacco use  Z72.0     5. SOB (shortness of breath)  R06.02    Less SOB    6. Primary hypertension  I10    BP better as was BPS 100 and decreasing amlodapine to 5 mg normalized blood pressure       Problem List Items Addressed This Visit       Cardiovascular and Mediastinum   Chronic diastolic heart  failure (HCC) (Chronic)   Aortic insufficiency - Primary (Chronic)   Nonrheumatic mitral valve regurgitation     Other   Tobacco use (Chronic)   Other Visit Diagnoses     SOB (shortness of breath)  Less SOB   Primary hypertension       BP better as was BPS 100 and decreasing amlodapine to 5 mg normalized blood pressure          Disposition:   Return in about 3 months (around 03/15/2023).    Total time spent: 30 minutes  Signed,  Adrian Blackwater, MD  12/14/2022 9:37 AM    [facility]

## 2022-12-31 IMAGING — MG MM DIGITAL SCREENING BILAT W/ TOMO AND CAD
8 of 14 series · 8 of 40 positions shown · non-contrast
Comparison: Previous exam(s).

CLINICAL DATA: Screening.

EXAM:
DIGITAL SCREENING BILATERAL MAMMOGRAM WITH TOMOSYNTHESIS AND CAD
TECHNIQUE: Bilateral screening digital craniocaudal and mediolateral oblique
mammograms were obtained. Bilateral screening digital breast
tomosynthesis was performed. The images were evaluated with
computer-aided detection.

[R CC synth-2D]
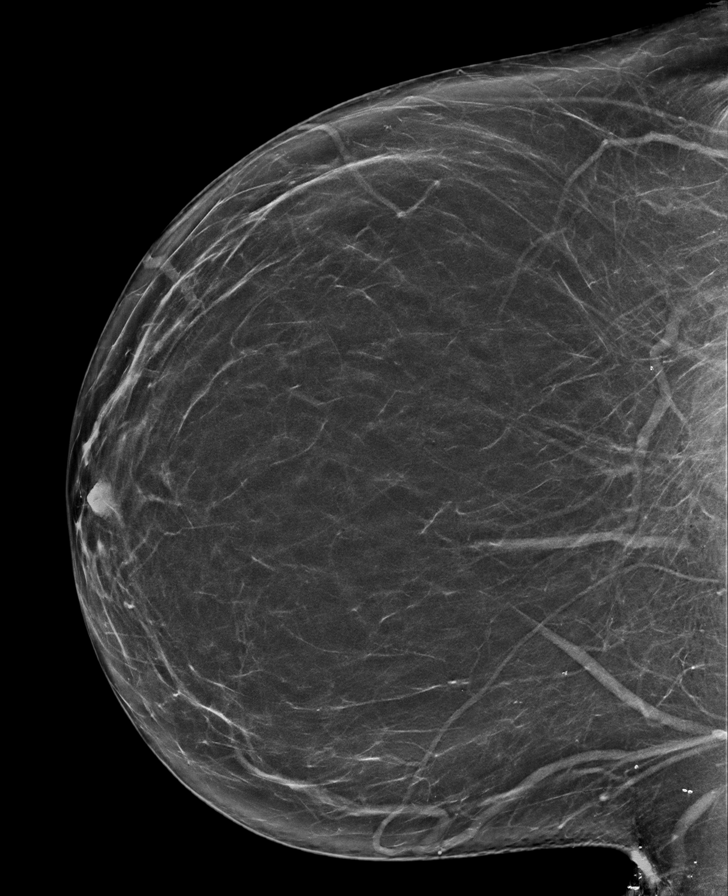

[L MLO synth-2D (1 of 2)]
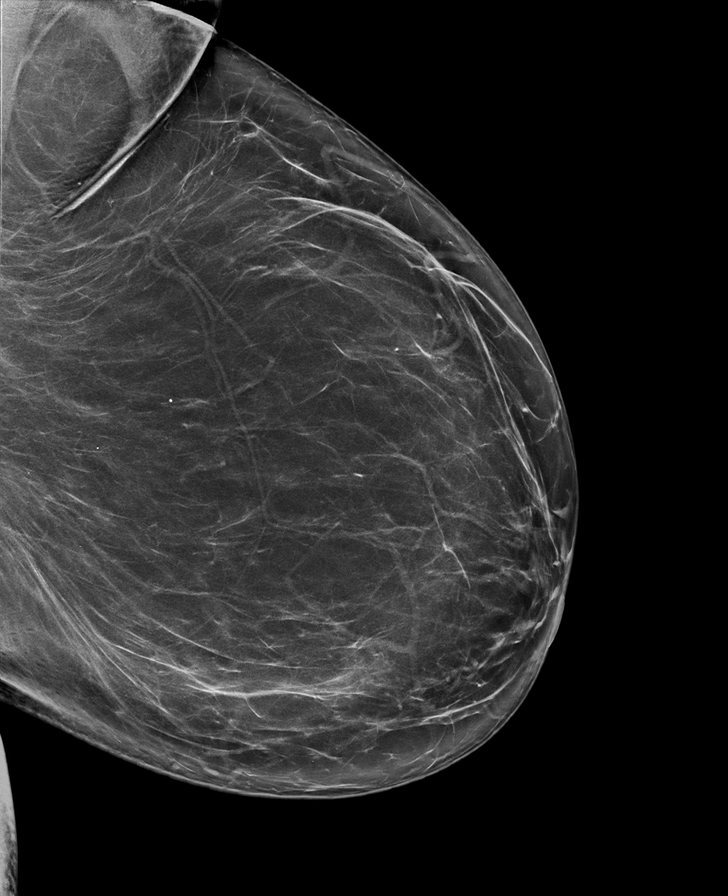

[L XCCL synth-2D]
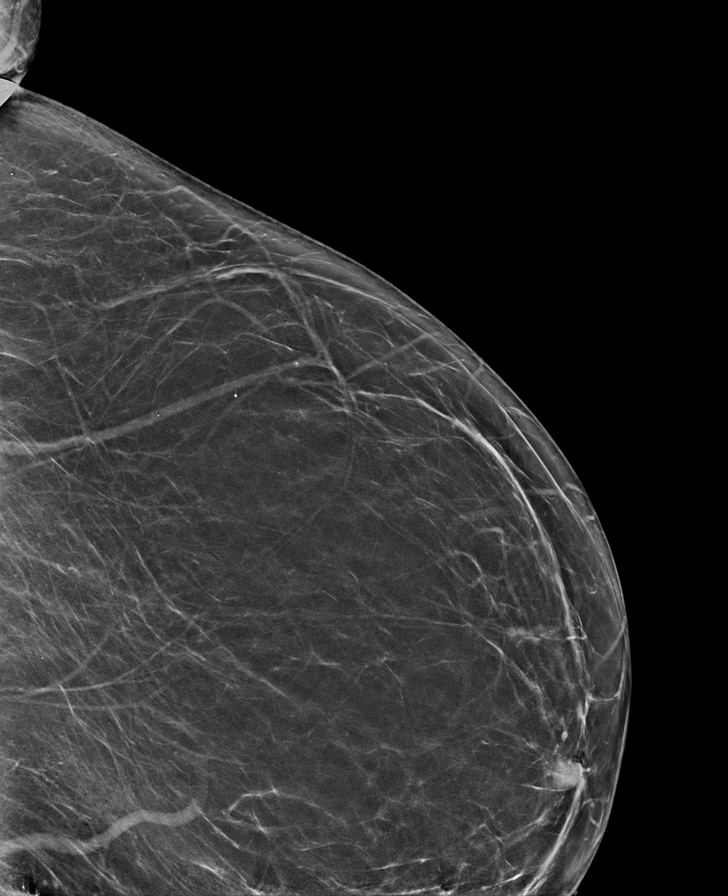

[L MLO synth-2D (2 of 2)]
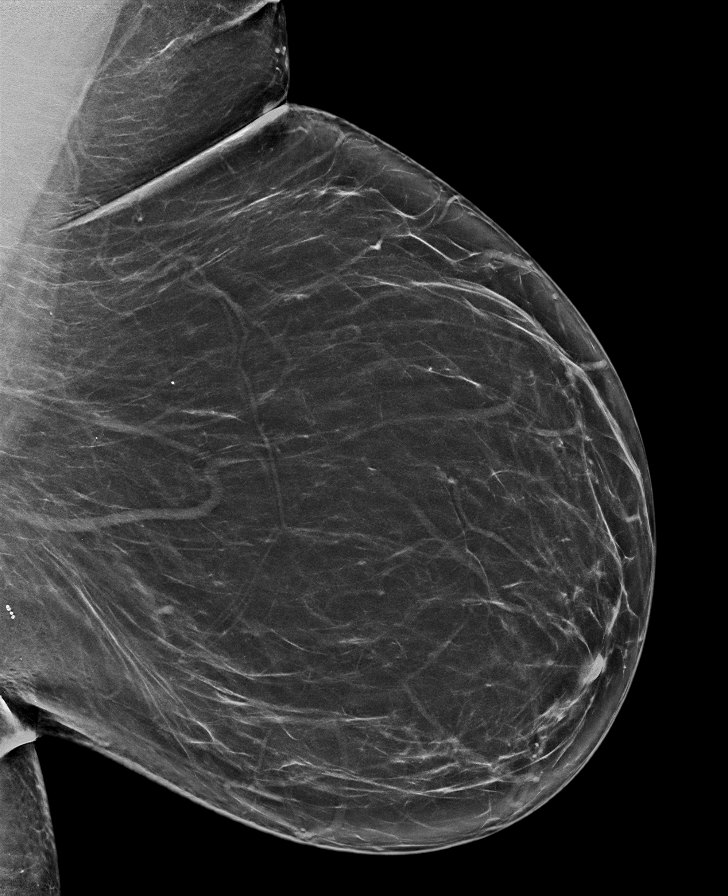

[R MLO synth-2D (1 of 2)]
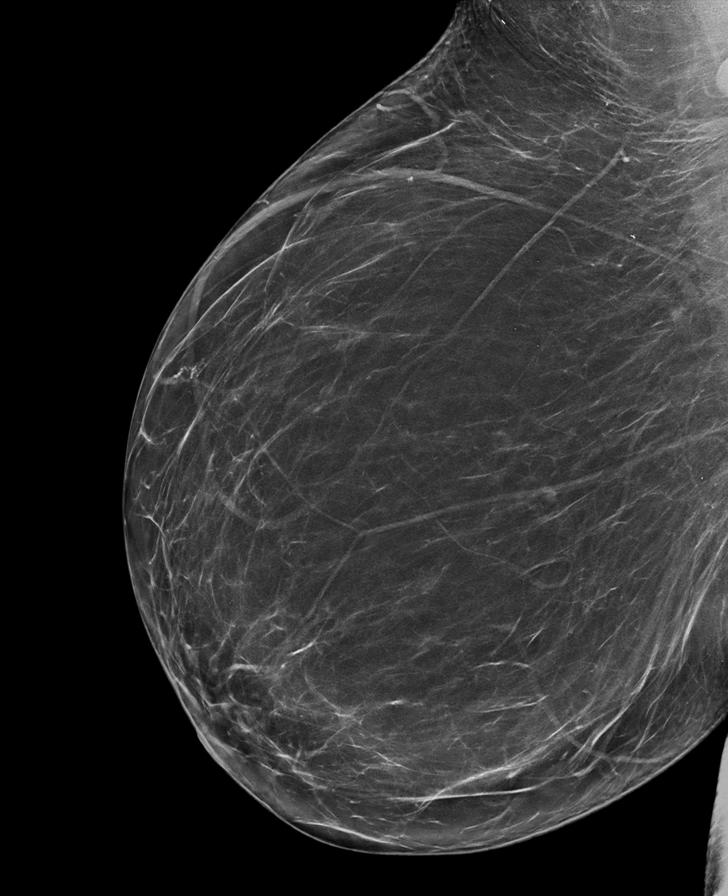

[R MLO synth-2D (2 of 2)]
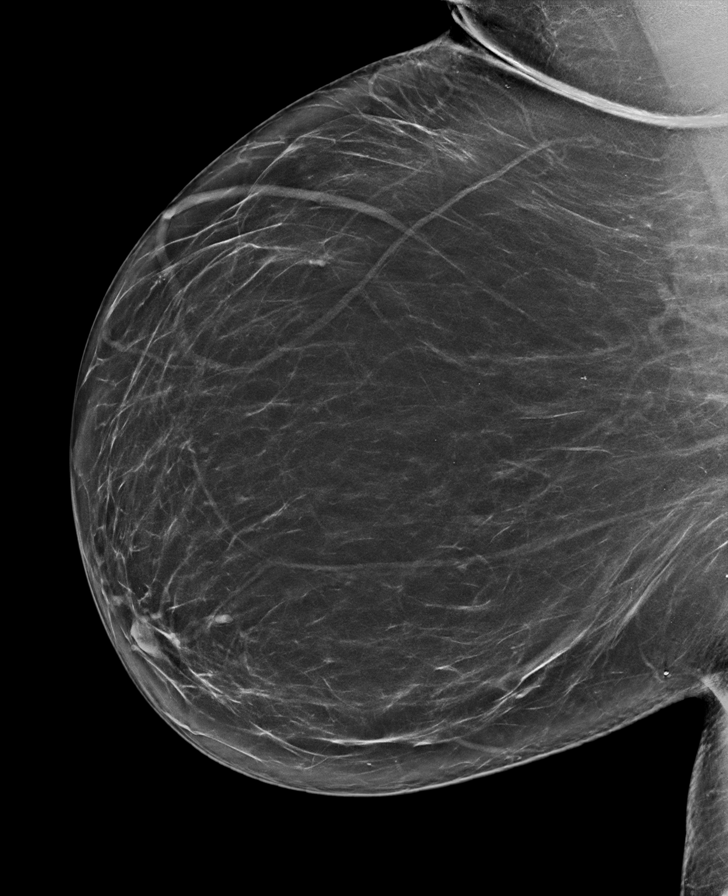

[L CC synth-2D]
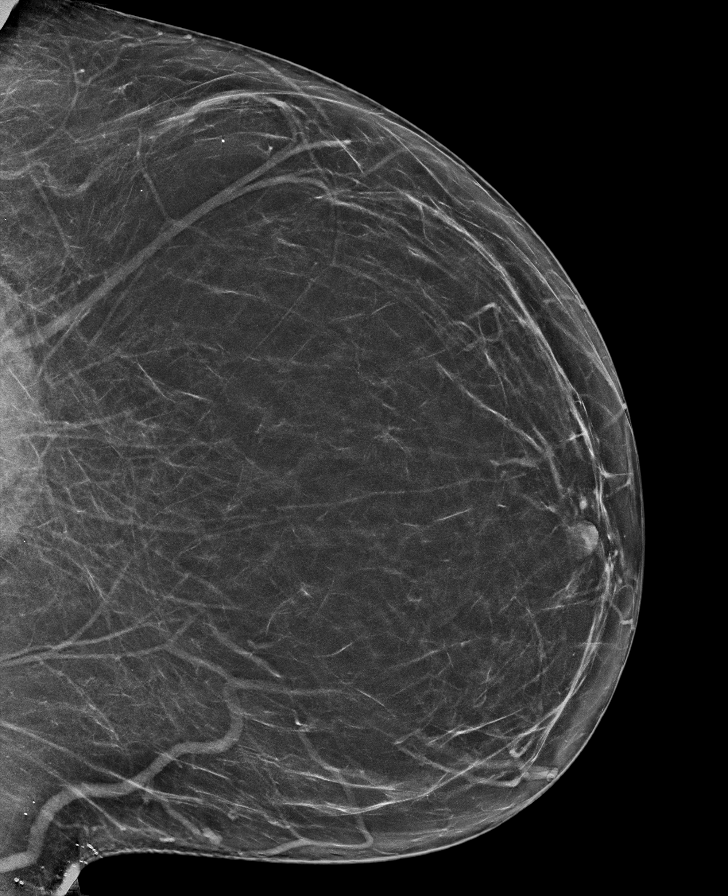

[R MLO tomo · tomo slice 41/82.0]
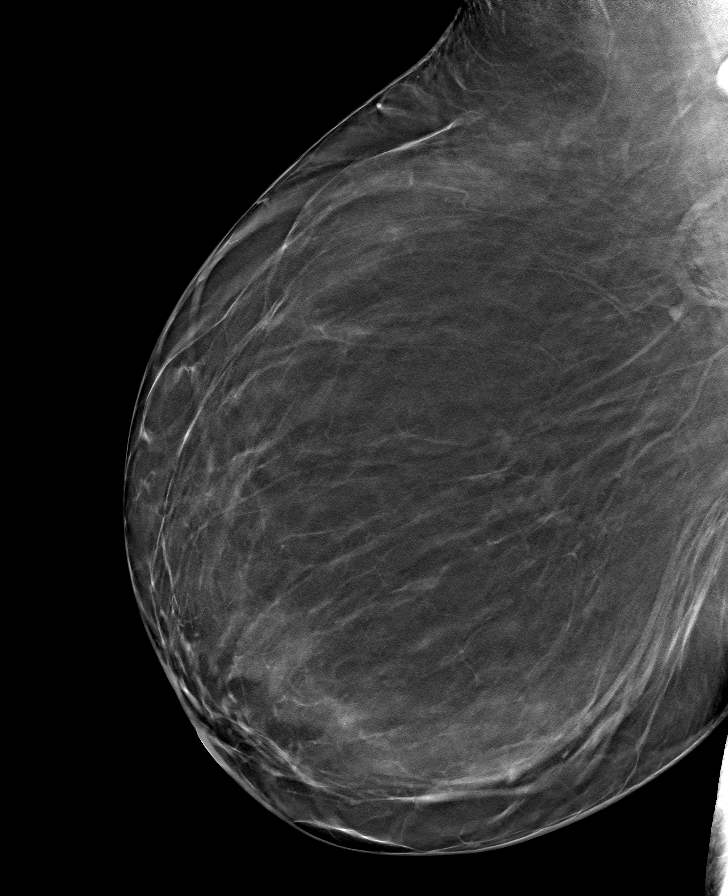

[8 of 40 positions shown; findings below may reference images not displayed]

ACR Breast Density Category b: There are scattered areas of
fibroglandular density.
FINDINGS: There are no findings suspicious for malignancy. The images were
evaluated with computer-aided detection.
IMPRESSION: No mammographic evidence of malignancy. A result letter of this
screening mammogram will be mailed directly to the patient.

RECOMMENDATION:
Screening mammogram in one year. (Code:WJ-I-BG6)

BI-RADS CATEGORY  1: Negative.

## 2023-01-28 ENCOUNTER — Other Ambulatory Visit: Payer: 59

## 2023-01-28 DIAGNOSIS — E782 Mixed hyperlipidemia: Secondary | ICD-10-CM | POA: Diagnosis not present

## 2023-01-28 DIAGNOSIS — I10 Essential (primary) hypertension: Secondary | ICD-10-CM

## 2023-01-28 DIAGNOSIS — R7303 Prediabetes: Secondary | ICD-10-CM

## 2023-01-28 DIAGNOSIS — E669 Obesity, unspecified: Secondary | ICD-10-CM

## 2023-01-29 LAB — CMP14+EGFR
ALT: 9 [IU]/L (ref 0–32)
AST: 18 [IU]/L (ref 0–40)
Albumin: 3.9 g/dL (ref 3.9–4.9)
Alkaline Phosphatase: 134 [IU]/L — ABNORMAL HIGH (ref 44–121)
BUN/Creatinine Ratio: 13 (ref 12–28)
BUN: 18 mg/dL (ref 8–27)
Bilirubin Total: 0.6 mg/dL (ref 0.0–1.2)
CO2: 22 mmol/L (ref 20–29)
Calcium: 9.6 mg/dL (ref 8.7–10.3)
Chloride: 106 mmol/L (ref 96–106)
Creatinine, Ser: 1.4 mg/dL — ABNORMAL HIGH (ref 0.57–1.00)
Globulin, Total: 3.2 g/dL (ref 1.5–4.5)
Glucose: 83 mg/dL (ref 70–99)
Potassium: 5.1 mmol/L (ref 3.5–5.2)
Sodium: 142 mmol/L (ref 134–144)
Total Protein: 7.1 g/dL (ref 6.0–8.5)
eGFR: 41 mL/min/{1.73_m2} — ABNORMAL LOW (ref >59)

## 2023-01-29 LAB — LIPID PANEL
Chol/HDL Ratio: 2.1 ratio (ref 0.0–4.4)
Cholesterol, Total: 125 mg/dL (ref 100–199)
HDL: 59 mg/dL (ref >39)
LDL Chol Calc (NIH): 52 mg/dL (ref 0–99)
Triglycerides: 65 mg/dL (ref 0–149)
VLDL Cholesterol Cal: 14 mg/dL (ref 5–40)

## 2023-01-29 LAB — HEMOGLOBIN A1C
Est. average glucose Bld gHb Est-mCnc: 128 mg/dL
Hgb A1c MFr Bld: 6.1 % — ABNORMAL HIGH (ref 4.8–5.6)

## 2023-01-29 LAB — TSH: TSH: 1.91 u[IU]/mL (ref 0.450–4.500)

## 2023-01-30 ENCOUNTER — Other Ambulatory Visit: Payer: Self-pay

## 2023-01-31 MED ORDER — ROSUVASTATIN CALCIUM 20 MG PO TABS: 20.0000 mg | ORAL_TABLET | Freq: Every day | ORAL | 3 refills | Status: DC

## 2023-02-01 ENCOUNTER — Encounter: Payer: Self-pay | Admitting: Cardiology

## 2023-02-01 ENCOUNTER — Ambulatory Visit (INDEPENDENT_AMBULATORY_CARE_PROVIDER_SITE_OTHER): Payer: 59 | Admitting: Cardiology

## 2023-02-01 VITALS — BP 130/80 | HR 53 | Ht 59.0 in | Wt 231.4 lb

## 2023-02-01 DIAGNOSIS — E782 Mixed hyperlipidemia: Secondary | ICD-10-CM

## 2023-02-01 DIAGNOSIS — R7303 Prediabetes: Secondary | ICD-10-CM

## 2023-02-01 DIAGNOSIS — I10 Essential (primary) hypertension: Secondary | ICD-10-CM | POA: Insufficient documentation

## 2023-02-01 NOTE — Progress Notes (Signed)
Established Patient Office Visit  Subjective:  Patient ID: Sarah Mack, female    DOB: 16-Mar-1956  Age: 67 y.o. MRN: [id number]  Chief Complaint  Patient presents with   Follow-up    Room 8    Patient in office for 6 month follow up, discuss recent lab results. Patient reports feeling well, no complaints today.  Hgb A1c improved, still pre-diabetic. Discussed decreasing sugar and starch intake. Exercise when tolerated.  Up to date on mammogram and colonoscopy.     No other concerns at this time.   Past Medical History:  Diagnosis Date   (HFpEF) heart failure with preserved ejection fraction (HCC)    a. 10/2015 Echo: EF 55-60%, no rwma, mild to mod AI, nl RV fxn.   Aortic insufficiency    a. 10/2015 Echo: Mild to mod AI.   GERD (gastroesophageal reflux disease)    History of stress test    a. 11/2016 Echo: Small region of mild fixed apical thinning. No ischemia/infarct. Low risk.   Hypertension    Leaky heart valve     Past Surgical History:  Procedure Laterality Date   COLONOSCOPY WITH PROPOFOL N/A 08/06/2017   Procedure: COLONOSCOPY WITH PROPOFOL;  Surgeon: Midge Minium, MD;  Location: [facility] ENDOSCOPY;  Service: Endoscopy;  Laterality: N/A;   COLONOSCOPY WITH PROPOFOL N/A 03/09/2019   Procedure: COLONOSCOPY WITH PROPOFOL;  Surgeon: Toledo, Boykin Nearing, MD;  Location: [facility] ENDOSCOPY;  Service: Gastroenterology;  Laterality: N/A;   NO PAST SURGERIES      Social History   Socioeconomic History   Marital status: Legally Separated    Spouse name: Not on file   Number of children: Not on file   Years of education: Not on file   Highest education level: Not on file  Occupational History   Not on file  Tobacco Use   Smoking status: Every Day    Current packs/day: 0.50    Average packs/day: 0.5 packs/day for 10.0 years (5.0 ttl pk-yrs)    Types: Cigarettes   Smokeless tobacco: Never  Vaping Use   Vaping status: Never Used  Substance and Sexual Activity   Alcohol  use: No   Drug use: No   Sexual activity: Not on file  Other Topics Concern   Not on file  Social History Narrative   Not on file   Social Determinants of Health   Financial Resource Strain: Not on file  Food Insecurity: Not on file  Transportation Needs: Not on file  Physical Activity: Not on file  Stress: Not on file  Social Connections: Not on file  Intimate Partner Violence: Not on file    Family History  Problem Relation Age of Onset   Liver cancer Mother    Heart failure Father    Diabetes Father    Hypertension Sister    Hypertension Brother    Leukemia Sister    Hypertension Brother    Alcoholism Brother    Hypertension Brother    Breast cancer Neg Hx     No Known Allergies  Review of Systems  Constitutional: Negative.   HENT: Negative.    Eyes: Negative.   Respiratory: Negative.  Negative for shortness of breath.   Cardiovascular: Negative.  Negative for chest pain.  Gastrointestinal: Negative.  Negative for abdominal pain, constipation and diarrhea.  Genitourinary: Negative.   Musculoskeletal:  Negative for joint pain and myalgias.  Skin: Negative.   Neurological: Negative.  Negative for dizziness and headaches.  Endo/Heme/Allergies: Negative.   All  other systems reviewed and are negative.      Objective:   BP 130/80   Pulse (!) 53   Ht 4\' 11"  (1.499 m)   Wt 231 lb 6.4 oz (105 kg)   LMP  (LMP Unknown)   SpO2 95%   BMI 46.74 kg/m   Vitals:   02/01/23 0917  BP: 130/80  Pulse: (!) 53  Height: 4\' 11"  (1.499 m)  Weight: 231 lb 6.4 oz (105 kg)  SpO2: 95%  BMI (Calculated): 46.71    Physical Exam Vitals and nursing note reviewed.  Constitutional:      Appearance: Normal appearance. She is normal weight.  HENT:     Head: Normocephalic and atraumatic.     Nose: Nose normal.     Mouth/Throat:     Mouth: Mucous membranes are moist.  Eyes:     Extraocular Movements: Extraocular movements intact.     Conjunctiva/sclera: Conjunctivae  normal.     Pupils: Pupils are equal, round, and reactive to light.  Cardiovascular:     Rate and Rhythm: Normal rate and regular rhythm.     Pulses: Normal pulses.     Heart sounds: Normal heart sounds.  Pulmonary:     Effort: Pulmonary effort is normal.     Breath sounds: Normal breath sounds.  Abdominal:     General: Abdomen is flat. Bowel sounds are normal.     Palpations: Abdomen is soft.  Musculoskeletal:        General: Normal range of motion.     Cervical back: Normal range of motion.  Skin:    General: Skin is warm and dry.  Neurological:     General: No focal deficit present.     Mental Status: She is alert and oriented to person, place, and time.  Psychiatric:        Mood and Affect: Mood normal.        Behavior: Behavior normal.        Thought Content: Thought content normal.        Judgment: Judgment normal.      No results found for any visits on 02/01/23.  Recent Results (from the past 2160 hour(s))  Hemoglobin A1c     Status: Abnormal   Collection Time: 01/28/23  9:28 AM  Result Value Ref Range   Hgb A1c MFr Bld 6.1 (H) 4.8 - 5.6 %    Comment:          Prediabetes: 5.7 - 6.4          Diabetes: >6.4          Glycemic control for adults with diabetes: <7.0    Est. average glucose Bld gHb Est-mCnc 128 mg/dL  TSH     Status: None   Collection Time: 01/28/23  9:28 AM  Result Value Ref Range   TSH 1.910 0.450 - 4.500 uIU/mL  CMP14+EGFR     Status: Abnormal   Collection Time: 01/28/23  9:28 AM  Result Value Ref Range   Glucose 83 70 - 99 mg/dL   BUN 18 8 - 27 mg/dL   Creatinine, Ser 4.25 (H) 0.57 - 1.00 mg/dL   eGFR 41 (L) >95 GL/OVF/6.43   BUN/Creatinine Ratio 13 12 - 28   Sodium 142 134 - 144 mmol/L   Potassium 5.1 3.5 - 5.2 mmol/L   Chloride 106 96 - 106 mmol/L   CO2 22 20 - 29 mmol/L   Calcium 9.6 8.7 - 10.3 mg/dL   Total Protein 7.1  6.0 - 8.5 g/dL   Albumin 3.9 3.9 - 4.9 g/dL   Globulin, Total 3.2 1.5 - 4.5 g/dL   Bilirubin Total 0.6 0.0 -  1.2 mg/dL   Alkaline Phosphatase 134 (H) 44 - 121 IU/L   AST 18 0 - 40 IU/L   ALT 9 0 - 32 IU/L  Lipid panel     Status: None   Collection Time: 01/28/23  9:28 AM  Result Value Ref Range   Cholesterol, Total 125 100 - 199 mg/dL   Triglycerides 65 0 - 149 mg/dL   HDL 59 >60 mg/dL   VLDL Cholesterol Cal 14 5 - 40 mg/dL   LDL Chol Calc (NIH) 52 0 - 99 mg/dL   Chol/HDL Ratio 2.1 0.0 - 4.4 ratio    Comment:                                   T. Chol/HDL Ratio                                             Men  Women                               1/2 Avg.Risk  3.4    3.3                                   Avg.Risk  5.0    4.4                                2X Avg.Risk  9.6    7.1                                3X Avg.Risk 23.4   11.0       Assessment & Plan:  Follow a strict diabetic diet and exercise.   Problem List Items Addressed This Visit       Cardiovascular and Mediastinum   Primary hypertension     Other   Mixed hyperlipidemia - Primary   Prediabetes    Return in about 6 months (around 08/01/2023) for with fasting labs prior.   Total time spent: 25 minutes  [employer], NP  02/01/2023   This document may have been prepared by Dragon Voice Recognition software and as such may include unintentional dictation errors.

## 2023-02-04 ENCOUNTER — Other Ambulatory Visit: Payer: Self-pay | Admitting: Cardiology

## 2023-03-15 ENCOUNTER — Ambulatory Visit (INDEPENDENT_AMBULATORY_CARE_PROVIDER_SITE_OTHER): Payer: 59 | Admitting: Cardiology

## 2023-03-15 ENCOUNTER — Encounter: Payer: Self-pay | Admitting: Cardiology

## 2023-03-15 ENCOUNTER — Ambulatory Visit (INDEPENDENT_AMBULATORY_CARE_PROVIDER_SITE_OTHER): Payer: 59 | Admitting: Cardiovascular Disease

## 2023-03-15 ENCOUNTER — Encounter: Payer: Self-pay | Admitting: Cardiovascular Disease

## 2023-03-15 VITALS — BP 134/84 | HR 54 | Ht 59.0 in | Wt 232.6 lb

## 2023-03-15 DIAGNOSIS — I340 Nonrheumatic mitral (valve) insufficiency: Secondary | ICD-10-CM | POA: Diagnosis not present

## 2023-03-15 DIAGNOSIS — I10 Essential (primary) hypertension: Secondary | ICD-10-CM | POA: Diagnosis not present

## 2023-03-15 DIAGNOSIS — I351 Nonrheumatic aortic (valve) insufficiency: Secondary | ICD-10-CM

## 2023-03-15 DIAGNOSIS — E782 Mixed hyperlipidemia: Secondary | ICD-10-CM | POA: Diagnosis not present

## 2023-03-15 DIAGNOSIS — L209 Atopic dermatitis, unspecified: Secondary | ICD-10-CM | POA: Diagnosis not present

## 2023-03-15 DIAGNOSIS — I5032 Chronic diastolic (congestive) heart failure: Secondary | ICD-10-CM

## 2023-03-15 DIAGNOSIS — I5033 Acute on chronic diastolic (congestive) heart failure: Secondary | ICD-10-CM

## 2023-03-15 MED ORDER — TRIAMCINOLONE ACETONIDE 0.5 % EX OINT: 1.0000 | TOPICAL_OINTMENT | Freq: Two times a day (BID) | CUTANEOUS | 0 refills | Status: DC

## 2023-03-15 MED ORDER — DAPAGLIFLOZIN PROPANEDIOL 10 MG PO TABS: 10.0000 mg | ORAL_TABLET | Freq: Every day | ORAL | 3 refills | Status: DC

## 2023-03-15 MED ORDER — METHYLPREDNISOLONE 4 MG PO TBPK: ORAL_TABLET | ORAL | 0 refills | Status: DC

## 2023-03-15 NOTE — Progress Notes (Signed)
Established Patient Office Visit  Subjective:  Patient ID: Sarah Mack, female    DOB: 10-31-1955  Age: 67 y.o. MRN: [id number]  Chief Complaint  Patient presents with   Rash    Patient in office for an acute visit, patient complaining of a rash on both hands, edema, burning and itching. Patient states symptoms started about 2 weeks ago after using a new foaming hand soap. She has tried OTC cream and benadryl at home with no relief. Will send in prednisone, kenalog cream. Recommend continuing Benadryl.   Rash This is a new problem. The current episode started 1 to 4 weeks ago. The problem is unchanged. The affected locations include the right hand and left hand. The rash is characterized by burning, dryness, itchiness, peeling, redness, swelling and pain. She was exposed to a new detergent/soap. Pertinent negatives include no diarrhea, joint pain or shortness of breath. Past treatments include moisturizer, anti-itch cream and antihistamine. The treatment provided no relief.    No other concerns at this time.   Past Medical History:  Diagnosis Date   (HFpEF) heart failure with preserved ejection fraction (HCC)    a. 10/2015 Echo: EF 55-60%, no rwma, mild to mod AI, nl RV fxn.   Aortic insufficiency    a. 10/2015 Echo: Mild to mod AI.   GERD (gastroesophageal reflux disease)    History of stress test    a. 11/2016 Echo: Small region of mild fixed apical thinning. No ischemia/infarct. Low risk.   Hypertension    Leaky heart valve     Past Surgical History:  Procedure Laterality Date   COLONOSCOPY WITH PROPOFOL N/A 08/06/2017   Procedure: COLONOSCOPY WITH PROPOFOL;  Surgeon: Midge Minium, MD;  Location: [facility] ENDOSCOPY;  Service: Endoscopy;  Laterality: N/A;   COLONOSCOPY WITH PROPOFOL N/A 03/09/2019   Procedure: COLONOSCOPY WITH PROPOFOL;  Surgeon: Toledo, Boykin Nearing, MD;  Location: [facility] ENDOSCOPY;  Service: Gastroenterology;  Laterality: N/A;   NO PAST SURGERIES      Social  History   Socioeconomic History   Marital status: Legally Separated    Spouse name: Not on file   Number of children: Not on file   Years of education: Not on file   Highest education level: Not on file  Occupational History   Not on file  Tobacco Use   Smoking status: Every Day    Current packs/day: 0.50    Average packs/day: 0.5 packs/day for 10.0 years (5.0 ttl pk-yrs)    Types: Cigarettes   Smokeless tobacco: Never  Vaping Use   Vaping status: Never Used  Substance and Sexual Activity   Alcohol use: No   Drug use: No   Sexual activity: Not on file  Other Topics Concern   Not on file  Social History Narrative   Not on file   Social Drivers of Health   Financial Resource Strain: Not on file  Food Insecurity: Not on file  Transportation Needs: Not on file  Physical Activity: Not on file  Stress: Not on file  Social Connections: Not on file  Intimate Partner Violence: Not on file    Family History  Problem Relation Age of Onset   Liver cancer Mother    Heart failure Father    Diabetes Father    Hypertension Sister    Hypertension Brother    Leukemia Sister    Hypertension Brother    Alcoholism Brother    Hypertension Brother    Breast cancer Neg Hx  No Known Allergies  Outpatient Medications Prior to Visit  Medication Sig   albuterol (VENTOLIN HFA) 108 (90 Base) MCG/ACT inhaler Inhale 1 puff into the lungs as needed for wheezing or shortness of breath.   amLODipine (NORVASC) 5 MG tablet Take 1 tablet (5 mg total) by mouth daily.   Aspirin 81 MG CAPS Take 1 capsule by mouth daily.   celecoxib (CELEBREX) 200 MG capsule TAKE 1 CAPSULE BY MOUTH TWICE A DAY   cetirizine (ZYRTEC) 10 MG tablet TAKE 1 TABLET BY MOUTH EVERYDAY AT BEDTIME   Cholecalciferol (VITAMIN D3) 25 MCG (1000 UT) CAPS Take 1 capsule by mouth daily.   dapagliflozin propanediol (FARXIGA) 10 MG TABS tablet Take 1 tablet (10 mg total) by mouth daily before breakfast.   loratadine (CLARITIN)  10 MG tablet Take 10 mg by mouth every morning.   metoprolol succinate (TOPROL-XL) 50 MG 24 hr tablet TAKE 1 TABLET BY MOUTH NIGHTLY AT BEDTIME FOR BLOOD PRESSURE   montelukast (SINGULAIR) 10 MG tablet TAKE 1 TABLET BY MOUTH EVERYDAY AT BEDTIME   ramipril (ALTACE) 10 MG capsule Take 1 capsule (10 mg total) by mouth every morning.   rosuvastatin (CRESTOR) 20 MG tablet Take 1 tablet (20 mg total) by mouth daily.   spironolactone (ALDACTONE) 25 MG tablet Take 1 tablet (25 mg total) by mouth daily.   No facility-administered medications prior to visit.    Review of Systems  Constitutional: Negative.   HENT: Negative.    Eyes: Negative.   Respiratory: Negative.  Negative for shortness of breath.   Cardiovascular: Negative.  Negative for chest pain.  Gastrointestinal: Negative.  Negative for abdominal pain, constipation and diarrhea.  Genitourinary: Negative.   Musculoskeletal:  Negative for joint pain and myalgias.  Skin:  Positive for rash.  Neurological: Negative.  Negative for dizziness and headaches.  Endo/Heme/Allergies: Negative.   All other systems reviewed and are negative.      Objective:   BP 134/84   Pulse (!) 54   Ht 4\' 11"  (1.499 m)   Wt 232 lb 9.4 oz (105.5 kg)   LMP  (LMP Unknown)   SpO2 96%   BMI 46.98 kg/m   Vitals:   03/15/23 0933  BP: 134/84  Pulse: (!) 54  Height: 4\' 11"  (1.499 m)  Weight: 232 lb 9.4 oz (105.5 kg)  SpO2: 96%  BMI (Calculated): 46.95    Physical Exam Vitals and nursing note reviewed.  Constitutional:      Appearance: Normal appearance. She is normal weight.  HENT:     Head: Normocephalic and atraumatic.     Nose: Nose normal.     Mouth/Throat:     Mouth: Mucous membranes are moist.  Eyes:     Extraocular Movements: Extraocular movements intact.     Conjunctiva/sclera: Conjunctivae normal.     Pupils: Pupils are equal, round, and reactive to light.  Cardiovascular:     Rate and Rhythm: Normal rate and regular rhythm.      Pulses: Normal pulses.     Heart sounds: Normal heart sounds.  Pulmonary:     Effort: Pulmonary effort is normal.     Breath sounds: Normal breath sounds.  Abdominal:     General: Abdomen is flat. Bowel sounds are normal.     Palpations: Abdomen is soft.  Musculoskeletal:        General: Normal range of motion.     Cervical back: Normal range of motion.  Skin:    General: Skin is warm and  dry.  Neurological:     General: No focal deficit present.     Mental Status: She is alert and oriented to person, place, and time.  Psychiatric:        Mood and Affect: Mood normal.        Behavior: Behavior normal.        Thought Content: Thought content normal.        Judgment: Judgment normal.      No results found for any visits on 03/15/23.  Recent Results (from the past 2160 hours)  Hemoglobin A1c     Status: Abnormal   Collection Time: 01/28/23  9:28 AM  Result Value Ref Range   Hgb A1c MFr Bld 6.1 (H) 4.8 - 5.6 %    Comment:          Prediabetes: 5.7 - 6.4          Diabetes: >6.4          Glycemic control for adults with diabetes: <7.0    Est. average glucose Bld gHb Est-mCnc 128 mg/dL  TSH     Status: None   Collection Time: 01/28/23  9:28 AM  Result Value Ref Range   TSH 1.910 0.450 - 4.500 uIU/mL  CMP14+EGFR     Status: Abnormal   Collection Time: 01/28/23  9:28 AM  Result Value Ref Range   Glucose 83 70 - 99 mg/dL   BUN 18 8 - 27 mg/dL   Creatinine, Ser 7.56 (H) 0.57 - 1.00 mg/dL   eGFR 41 (L) >43 PI/RJJ/8.84   BUN/Creatinine Ratio 13 12 - 28   Sodium 142 134 - 144 mmol/L   Potassium 5.1 3.5 - 5.2 mmol/L   Chloride 106 96 - 106 mmol/L   CO2 22 20 - 29 mmol/L   Calcium 9.6 8.7 - 10.3 mg/dL   Total Protein 7.1 6.0 - 8.5 g/dL   Albumin 3.9 3.9 - 4.9 g/dL   Globulin, Total 3.2 1.5 - 4.5 g/dL   Bilirubin Total 0.6 0.0 - 1.2 mg/dL   Alkaline Phosphatase 134 (H) 44 - 121 IU/L   AST 18 0 - 40 IU/L   ALT 9 0 - 32 IU/L  Lipid panel     Status: None   Collection Time:  01/28/23  9:28 AM  Result Value Ref Range   Cholesterol, Total 125 100 - 199 mg/dL   Triglycerides 65 0 - 149 mg/dL   HDL 59 >16 mg/dL   VLDL Cholesterol Cal 14 5 - 40 mg/dL   LDL Chol Calc (NIH) 52 0 - 99 mg/dL   Chol/HDL Ratio 2.1 0.0 - 4.4 ratio    Comment:                                   T. Chol/HDL Ratio                                             Men  Women                               1/2 Avg.Risk  3.4    3.3  Avg.Risk  5.0    4.4                                2X Avg.Risk  9.6    7.1                                3X Avg.Risk 23.4   11.0       Assessment & Plan:  Prednisone Kenalog cream Benadryl  Problem List Items Addressed This Visit       Musculoskeletal and Integument   Atopic dermatitis - Primary    Return if symptoms worsen or fail to improve.   Total time spent: 25 minutes  [employer], NP  03/15/2023   This document may have been prepared by Dragon Voice Recognition software and as such may include unintentional dictation errors.

## 2023-03-15 NOTE — Progress Notes (Signed)
Cardiology Office Note   Date:  03/15/2023   ID:  Sarah Mack, Sarah Mack Dec 20, 1955, MRN [id number]  PCP:  Sarah Ivan, NP  Cardiologist:  Sarah Blackwater, MD      History of Present Illness: Sarah Mack is a 67 y.o. female who presents for  Chief Complaint  Patient presents with   Follow-up    3 month follow up    Has DOE, and rash from soap for which needs to see Amber      Past Medical History:  Diagnosis Date   (HFpEF) heart failure with preserved ejection fraction (HCC)    a. 10/2015 Echo: EF 55-60%, no rwma, mild to mod AI, nl RV fxn.   Aortic insufficiency    a. 10/2015 Echo: Mild to mod AI.   GERD (gastroesophageal reflux disease)    History of stress test    a. 11/2016 Echo: Small region of mild fixed apical thinning. No ischemia/infarct. Low risk.   Hypertension    Leaky heart valve      Past Surgical History:  Procedure Laterality Date   COLONOSCOPY WITH PROPOFOL N/A 08/06/2017   Procedure: COLONOSCOPY WITH PROPOFOL;  Surgeon: Midge Minium, MD;  Location[facility] ENDOSCOPY;  Service: Endoscopy;  Laterality: N/A;   COLONOSCOPY WITH PROPOFOL N/A 03/09/2019   Procedure: COLONOSCOPY WITH PROPOFOL;  Surgeon: Toledo, Boykin Nearing, MD;  Location: [facility] ENDOSCOPY;  Service: Gastroenterology;  Laterality: N/A;   NO PAST SURGERIES       Current Outpatient Medications  Medication Sig Dispense Refill   albuterol (VENTOLIN HFA) 108 (90 Base) MCG/ACT inhaler Inhale 1 puff into the lungs as needed for wheezing or shortness of breath.     amLODipine (NORVASC) 5 MG tablet Take 1 tablet (5 mg total) by mouth daily. 30 tablet 11   Aspirin 81 MG CAPS Take 1 capsule by mouth daily.     celecoxib (CELEBREX) 200 MG capsule TAKE 1 CAPSULE BY MOUTH TWICE A DAY 60 capsule 1   cetirizine (ZYRTEC) 10 MG tablet TAKE 1 TABLET BY MOUTH EVERYDAY AT BEDTIME 90 tablet 3   Cholecalciferol (VITAMIN D3) 25 MCG (1000 UT) CAPS Take 1 capsule by mouth daily.     dapagliflozin propanediol  (FARXIGA) 10 MG TABS tablet Take 1 tablet (10 mg total) by mouth daily before breakfast. 30 tablet 3   loratadine (CLARITIN) 10 MG tablet Take 10 mg by mouth every morning.     metoprolol succinate (TOPROL-XL) 50 MG 24 hr tablet TAKE 1 TABLET BY MOUTH NIGHTLY AT BEDTIME FOR BLOOD PRESSURE 90 tablet 3   montelukast (SINGULAIR) 10 MG tablet TAKE 1 TABLET BY MOUTH EVERYDAY AT BEDTIME 90 tablet 3   ramipril (ALTACE) 10 MG capsule Take 1 capsule (10 mg total) by mouth every morning. 90 capsule 2   rosuvastatin (CRESTOR) 20 MG tablet Take 1 tablet (20 mg total) by mouth daily. 90 tablet 3   spironolactone (ALDACTONE) 25 MG tablet Take 1 tablet (25 mg total) by mouth daily. 90 tablet 0   No current facility-administered medications for this visit.    Allergies:   Patient has no known allergies.    Social History:   reports that she has been smoking cigarettes. She has a 5 pack-year smoking history. She has never used smokeless tobacco. She reports that she does not drink alcohol and does not use drugs.   Family History:  family history includes Alcoholism in her brother; Diabetes in her father; Heart failure in her father; Hypertension in  her brother, brother, brother, and sister; Leukemia in her sister; Liver cancer in her mother.    ROS:     Review of Systems  Constitutional: Negative.   HENT: Negative.    Eyes: Negative.   Respiratory: Negative.    Gastrointestinal: Negative.   Genitourinary: Negative.   Musculoskeletal: Negative.   Skin: Negative.   Neurological: Negative.   Endo/Heme/Allergies: Negative.   Psychiatric/Behavioral: Negative.    All other systems reviewed and are negative.     All other systems are reviewed and negative.    PHYSICAL EXAM: VS:  BP 134/84   Pulse (!) 54   Ht 4\' 11"  (1.499 m)   Wt 232 lb 9.6 oz (105.5 kg)   LMP  (LMP Unknown)   SpO2 96%   BMI 46.98 kg/m  , BMI Body mass index is 46.98 kg/m. Last weight:  Wt Readings from Last 3 Encounters:   03/15/23 232 lb 9.6 oz (105.5 kg)  02/01/23 231 lb 6.4 oz (105 kg)  12/14/22 229 lb (103.9 kg)     Physical Exam Constitutional:      Appearance: Normal appearance.  Cardiovascular:     Rate and Rhythm: Normal rate and regular rhythm.     Heart sounds: Normal heart sounds.  Pulmonary:     Effort: Pulmonary effort is normal.     Breath sounds: Normal breath sounds.  Musculoskeletal:     Right lower leg: No edema.     Left lower leg: No edema.  Neurological:     Mental Status: She is alert.       EKG:   Recent Labs: 07/27/2022: Hemoglobin 13.8; Platelets 198 01/28/2023: ALT 9; BUN 18; Creatinine, Ser 1.40; Potassium 5.1; Sodium 142; TSH 1.910    Lipid Panel    Component Value Date/Time   CHOL 125 01/28/2023 0928   TRIG 65 01/28/2023 0928   HDL 59 01/28/2023 0928   CHOLHDL 2.1 01/28/2023 0928   LDLCALC 52 01/28/2023 0928      Other studies Reviewed: Additional studies/ records that were reviewed today include:  Review of the above records demonstrates:      11/29/2016    9:38 AM 05/16/2016    1:27 PM  PAD Screen  Previous PAD dx? No No  Previous surgical procedure? No No  Pain with walking? No No  Feet/toe relief with dangling? Yes Yes  Painful, non-healing ulcers? No No  Extremities discolored? No No      ASSESSMENT AND PLAN:    ICD-10-CM   1. CHF (congestive heart failure), NYHA class III, acute on chronic, diastolic (HCC)  I50.33 dapagliflozin propanediol (FARXIGA) 10 MG TABS tablet   Will add farxiga as has diastolic dysfunction, normal LVEF    2. Mixed hyperlipidemia  E78.2     3. Primary hypertension  I10     4. Nonrheumatic aortic valve insufficiency  I35.1    ITs moderate to severe ARm compared to 2022 had echo and was mild to moderate. Discussed AVR. Will add farxiga in mean time.    5. Chronic diastolic heart failure (HCC)  V56.43     6. Nonrheumatic mitral valve regurgitation  I34.0        Problem List Items Addressed This Visit        Cardiovascular and Mediastinum   Chronic diastolic heart failure (HCC) (Chronic)   Aortic insufficiency (Chronic)   Nonrheumatic mitral valve regurgitation   Primary hypertension     Other   Mixed hyperlipidemia   Other Visit Diagnoses  CHF (congestive heart failure), NYHA class III, acute on chronic, diastolic (HCC)    -  Primary   Will add farxiga as has diastolic dysfunction, normal LVEF   Relevant Medications   dapagliflozin propanediol (FARXIGA) 10 MG TABS tablet          Disposition:   Return in about 5 weeks (around 04/19/2023).    Total time spent: 30 minutes  Signed,  Sarah Blackwater, MD  03/15/2023 9:27 AM    [facility]

## 2023-03-31 ENCOUNTER — Other Ambulatory Visit: Payer: Self-pay | Admitting: Cardiovascular Disease

## 2023-03-31 DIAGNOSIS — I5032 Chronic diastolic (congestive) heart failure: Secondary | ICD-10-CM

## 2023-04-08 ENCOUNTER — Telehealth: Payer: Self-pay | Admitting: Cardiology

## 2023-04-08 ENCOUNTER — Other Ambulatory Visit: Payer: Self-pay

## 2023-04-08 NOTE — Telephone Encounter (Signed)
 Patient called in to see if she should still be taking spironolactone or not? She is out of refills. If she should be can you send new Rx to [facility] [address].

## 2023-04-08 NOTE — Telephone Encounter (Signed)
 Refill was already taken care of by shaukat on 01/07

## 2023-04-10 ENCOUNTER — Other Ambulatory Visit: Payer: Self-pay | Admitting: Cardiology

## 2023-04-19 ENCOUNTER — Ambulatory Visit (INDEPENDENT_AMBULATORY_CARE_PROVIDER_SITE_OTHER): Payer: 59 | Admitting: Cardiovascular Disease

## 2023-04-19 ENCOUNTER — Encounter: Payer: Self-pay | Admitting: Cardiovascular Disease

## 2023-04-19 VITALS — BP 132/84 | HR 57 | Ht 60.0 in | Wt 231.4 lb

## 2023-04-19 DIAGNOSIS — R0789 Other chest pain: Secondary | ICD-10-CM | POA: Diagnosis not present

## 2023-04-19 DIAGNOSIS — I5033 Acute on chronic diastolic (congestive) heart failure: Secondary | ICD-10-CM

## 2023-04-19 DIAGNOSIS — I351 Nonrheumatic aortic (valve) insufficiency: Secondary | ICD-10-CM

## 2023-04-19 DIAGNOSIS — I340 Nonrheumatic mitral (valve) insufficiency: Secondary | ICD-10-CM

## 2023-04-19 DIAGNOSIS — I10 Essential (primary) hypertension: Secondary | ICD-10-CM | POA: Diagnosis not present

## 2023-04-19 DIAGNOSIS — E782 Mixed hyperlipidemia: Secondary | ICD-10-CM

## 2023-04-19 DIAGNOSIS — I5032 Chronic diastolic (congestive) heart failure: Secondary | ICD-10-CM

## 2023-04-19 NOTE — Progress Notes (Signed)
Cardiology Office Note   Date:  04/19/2023   ID:  Sarah, Menser Mack, MRN [id number]  PCP:  Marisue Ivan, NP  Cardiologist:  Adrian Blackwater, MD      History of Present Illness: Sarah Mack is a 68 y.o. female who presents for  Chief Complaint  Patient presents with   Follow-up    5 week follow up    Numbness    Both hands, forearms are numb    Hands feel numb and arms hurts, with occasional chest tightness.  Chest Pain  This is a new problem. The current episode started 1 to 4 weeks ago. The onset quality is sudden. The problem has been waxing and waning.      Past Medical History:  Diagnosis Date   (HFpEF) heart failure with preserved ejection fraction (HCC)    a. 10/2015 Echo: EF 55-60%, no rwma, mild to mod AI, nl RV fxn.   Aortic insufficiency    a. 10/2015 Echo: Mild to mod AI.   GERD (gastroesophageal reflux disease)    History of stress test    a. 11/2016 Echo: Small region of mild fixed apical thinning. No ischemia/infarct. Low risk.   Hypertension    Leaky heart valve      Past Surgical History:  Procedure Laterality Date   COLONOSCOPY WITH PROPOFOL N/A 08/06/2017   Procedure: COLONOSCOPY WITH PROPOFOL;  Surgeon: Midge Minium, MD;  Location: [facility] ENDOSCOPY;  Service: Endoscopy;  Laterality: N/A;   COLONOSCOPY WITH PROPOFOL N/A 03/09/2019   Procedure: COLONOSCOPY WITH PROPOFOL;  Surgeon: Toledo, Boykin Nearing, MD;  Location: [facility] ENDOSCOPY;  Service: Gastroenterology;  Laterality: N/A;   NO PAST SURGERIES       Current Outpatient Medications  Medication Sig Dispense Refill   albuterol (VENTOLIN HFA) 108 (90 Base) MCG/ACT inhaler Inhale 1 puff into the lungs as needed for wheezing or shortness of breath.     amLODipine (NORVASC) 5 MG tablet Take 1 tablet (5 mg total) by mouth daily. 30 tablet 11   Aspirin 81 MG CAPS Take 1 capsule by mouth daily.     celecoxib (CELEBREX) 200 MG capsule TAKE 1 CAPSULE BY MOUTH TWICE A DAY 60 capsule 1    cetirizine (ZYRTEC) 10 MG tablet TAKE 1 TABLET BY MOUTH EVERYDAY AT BEDTIME 90 tablet 3   Cholecalciferol (VITAMIN D3) 25 MCG (1000 UT) CAPS Take 1 capsule by mouth daily.     dapagliflozin propanediol (FARXIGA) 10 MG TABS tablet Take 1 tablet (10 mg total) by mouth daily before breakfast. 30 tablet 3   loratadine (CLARITIN) 10 MG tablet Take 10 mg by mouth every morning.     methylPREDNISolone (MEDROL DOSEPAK) 4 MG TBPK tablet Take as directed 21 each 0   metoprolol succinate (TOPROL-XL) 50 MG 24 hr tablet TAKE 1 TABLET BY MOUTH NIGHTLY AT BEDTIME FOR BLOOD PRESSURE 90 tablet 3   montelukast (SINGULAIR) 10 MG tablet TAKE 1 TABLET BY MOUTH EVERYDAY AT BEDTIME 90 tablet 3   ramipril (ALTACE) 10 MG capsule Take 1 capsule (10 mg total) by mouth every morning. 90 capsule 2   rosuvastatin (CRESTOR) 20 MG tablet Take 1 tablet (20 mg total) by mouth daily. 90 tablet 3   spironolactone (ALDACTONE) 25 MG tablet TAKE 1 TABLET (25 MG TOTAL) BY MOUTH DAILY. 90 tablet 0   triamcinolone ointment (KENALOG) 0.5 % Apply 1 Application topically 2 (two) times daily. 30 g 0   No current facility-administered medications for this visit.  Allergies:   Patient has no known allergies.    Social History:   reports that she has been smoking cigarettes. She has a 5 pack-year smoking history. She has never used smokeless tobacco. She reports that she does not drink alcohol and does not use drugs.   Family History:  family history includes Alcoholism in her brother; Diabetes in her father; Heart failure in her father; Hypertension in her brother, brother, brother, and sister; Leukemia in her sister; Liver cancer in her mother.    ROS:     Review of Systems  Constitutional: Negative.   HENT: Negative.    Eyes: Negative.   Respiratory: Negative.    Cardiovascular:  Positive for chest pain.  Gastrointestinal: Negative.   Genitourinary: Negative.   Musculoskeletal: Negative.   Skin: Negative.   Neurological:  Negative.   Endo/Heme/Allergies: Negative.   Psychiatric/Behavioral: Negative.    All other systems reviewed and are negative.     All other systems are reviewed and negative.    PHYSICAL EXAM: VS:  BP 132/84   Pulse (!) 57   Ht 5' (1.524 m)   Wt 231 lb 6.4 oz (105 kg)   LMP  (LMP Unknown)   SpO2 94%   BMI 45.19 kg/m  , BMI Body mass index is 45.19 kg/m. Last weight:  Wt Readings from Last 3 Encounters:  04/19/23 231 lb 6.4 oz (105 kg)  03/15/23 232 lb 9.4 oz (105.5 kg)  03/15/23 232 lb 9.6 oz (105.5 kg)     Physical Exam Constitutional:      Appearance: Normal appearance.  Cardiovascular:     Rate and Rhythm: Normal rate and regular rhythm.     Heart sounds: Normal heart sounds.  Pulmonary:     Effort: Pulmonary effort is normal.     Breath sounds: Normal breath sounds.  Musculoskeletal:     Right lower leg: No edema.     Left lower leg: No edema.  Neurological:     Mental Status: She is alert.       EKG:   Recent Labs: 07/27/2022: Hemoglobin 13.8; Platelets 198 01/28/2023: ALT 9; BUN 18; Creatinine, Ser 1.40; Potassium 5.1; Sodium 142; TSH 1.910    Lipid Panel    Component Value Date/Time   CHOL 125 01/28/2023 0928   TRIG 65 01/28/2023 0928   HDL 59 01/28/2023 0928   CHOLHDL 2.1 01/28/2023 0928   LDLCALC 52 01/28/2023 0928      Other studies Reviewed: Additional studies/ records that were reviewed today include:  Review of the above records demonstrates:      11/29/2016    9:38 AM 05/16/2016    1:27 PM  PAD Screen  Previous PAD dx? No No  Previous surgical procedure? No No  Pain with walking? No No  Feet/toe relief with dangling? Yes Yes  Painful, non-healing ulcers? No No  Extremities discolored? No No      ASSESSMENT AND PLAN:    ICD-10-CM   1. CHF (congestive heart failure), NYHA class III, acute on chronic, diastolic (HCC)  I50.33 PCV ECHOCARDIOGRAM COMPLETE    MYOCARDIAL PERFUSION IMAGING    2. Mixed hyperlipidemia  E78.2 PCV  ECHOCARDIOGRAM COMPLETE    MYOCARDIAL PERFUSION IMAGING    3. Primary hypertension  I10 PCV ECHOCARDIOGRAM COMPLETE    MYOCARDIAL PERFUSION IMAGING    4. Nonrheumatic aortic valve insufficiency  I35.1 PCV ECHOCARDIOGRAM COMPLETE    MYOCARDIAL PERFUSION IMAGING   moderate to severe    5. Nonrheumatic mitral valve  regurgitation  I34.0 PCV ECHOCARDIOGRAM COMPLETE    MYOCARDIAL PERFUSION IMAGING   moderate, advise f/u echo    6. Chronic diastolic heart failure (HCC)  N56.21 PCV ECHOCARDIOGRAM COMPLETE    MYOCARDIAL PERFUSION IMAGING    7. Other chest pain  R07.89 PCV ECHOCARDIOGRAM COMPLETE    MYOCARDIAL PERFUSION IMAGING   Advise echo and stress test       Problem List Items Addressed This Visit       Cardiovascular and Mediastinum   Chronic diastolic heart failure (HCC) (Chronic)   Relevant Orders   PCV ECHOCARDIOGRAM COMPLETE   MYOCARDIAL PERFUSION IMAGING   Aortic insufficiency (Chronic)   Relevant Orders   PCV ECHOCARDIOGRAM COMPLETE   MYOCARDIAL PERFUSION IMAGING   Nonrheumatic mitral valve regurgitation   Relevant Orders   PCV ECHOCARDIOGRAM COMPLETE   MYOCARDIAL PERFUSION IMAGING   Primary hypertension   Relevant Orders   PCV ECHOCARDIOGRAM COMPLETE   MYOCARDIAL PERFUSION IMAGING     Other   Mixed hyperlipidemia   Relevant Orders   PCV ECHOCARDIOGRAM COMPLETE   MYOCARDIAL PERFUSION IMAGING   Other Visit Diagnoses       CHF (congestive heart failure), NYHA class III, acute on chronic, diastolic (HCC)    -  Primary   Relevant Orders   PCV ECHOCARDIOGRAM COMPLETE   MYOCARDIAL PERFUSION IMAGING     Other chest pain       Advise echo and stress test   Relevant Orders   PCV ECHOCARDIOGRAM COMPLETE   MYOCARDIAL PERFUSION IMAGING          Disposition:   Return in about 4 weeks (around 05/17/2023) for echo, stress test and f/u.    Total time spent: 30 minutes  Signed,  Adrian Blackwater, MD  04/19/2023 9:40 AM    [facility]

## 2023-04-24 ENCOUNTER — Telehealth: Payer: Self-pay | Admitting: Cardiology

## 2023-04-24 NOTE — Telephone Encounter (Signed)
Monica NP, [phone] with [employer], called stating that there was abnormal findings for the patient, her PAD in her right leg was 0.58 moderate & left leg 0.84 mild. Just wanting to left you know anything lower than .90 they do report this. Maxine Glenn will fax results over.

## 2023-05-02 ENCOUNTER — Ambulatory Visit: Payer: 59

## 2023-05-03 ENCOUNTER — Other Ambulatory Visit: Payer: 59

## 2023-05-09 ENCOUNTER — Ambulatory Visit (INDEPENDENT_AMBULATORY_CARE_PROVIDER_SITE_OTHER): Payer: 59

## 2023-05-09 DIAGNOSIS — R0789 Other chest pain: Secondary | ICD-10-CM

## 2023-05-09 DIAGNOSIS — I5033 Acute on chronic diastolic (congestive) heart failure: Secondary | ICD-10-CM | POA: Diagnosis not present

## 2023-05-09 DIAGNOSIS — I340 Nonrheumatic mitral (valve) insufficiency: Secondary | ICD-10-CM | POA: Diagnosis not present

## 2023-05-09 DIAGNOSIS — I5032 Chronic diastolic (congestive) heart failure: Secondary | ICD-10-CM

## 2023-05-09 DIAGNOSIS — E782 Mixed hyperlipidemia: Secondary | ICD-10-CM

## 2023-05-09 DIAGNOSIS — R0609 Other forms of dyspnea: Secondary | ICD-10-CM

## 2023-05-09 DIAGNOSIS — I351 Nonrheumatic aortic (valve) insufficiency: Secondary | ICD-10-CM

## 2023-05-09 DIAGNOSIS — I10 Essential (primary) hypertension: Secondary | ICD-10-CM

## 2023-05-10 ENCOUNTER — Encounter: Payer: Self-pay | Admitting: Cardiovascular Disease

## 2023-05-10 MED ORDER — TECHNETIUM TC 99M SESTAMIBI GENERIC - CARDIOLITE
31.1000 | Freq: Once | INTRAVENOUS | Status: AC | PRN
  Administered 2023-05-09: 31.1 via INTRAVENOUS

## 2023-05-10 MED ORDER — TECHNETIUM TC 99M SESTAMIBI GENERIC - CARDIOLITE
9.9000 | Freq: Once | INTRAVENOUS | Status: AC | PRN
  Administered 2023-05-09: 9.9 via INTRAVENOUS

## 2023-05-13 ENCOUNTER — Ambulatory Visit: Payer: 59 | Admitting: Cardiovascular Disease

## 2023-05-24 ENCOUNTER — Ambulatory Visit: Payer: 59

## 2023-05-24 DIAGNOSIS — I340 Nonrheumatic mitral (valve) insufficiency: Secondary | ICD-10-CM | POA: Diagnosis not present

## 2023-05-24 DIAGNOSIS — I371 Nonrheumatic pulmonary valve insufficiency: Secondary | ICD-10-CM | POA: Diagnosis not present

## 2023-05-24 DIAGNOSIS — R0789 Other chest pain: Secondary | ICD-10-CM

## 2023-05-24 DIAGNOSIS — I5033 Acute on chronic diastolic (congestive) heart failure: Secondary | ICD-10-CM

## 2023-05-24 DIAGNOSIS — I5032 Chronic diastolic (congestive) heart failure: Secondary | ICD-10-CM

## 2023-05-24 DIAGNOSIS — I361 Nonrheumatic tricuspid (valve) insufficiency: Secondary | ICD-10-CM

## 2023-05-24 DIAGNOSIS — I10 Essential (primary) hypertension: Secondary | ICD-10-CM

## 2023-05-24 DIAGNOSIS — E782 Mixed hyperlipidemia: Secondary | ICD-10-CM

## 2023-05-24 DIAGNOSIS — I351 Nonrheumatic aortic (valve) insufficiency: Secondary | ICD-10-CM

## 2023-05-31 ENCOUNTER — Encounter: Payer: Self-pay | Admitting: Cardiovascular Disease

## 2023-05-31 ENCOUNTER — Ambulatory Visit (INDEPENDENT_AMBULATORY_CARE_PROVIDER_SITE_OTHER): Payer: 59 | Admitting: Cardiovascular Disease

## 2023-05-31 VITALS — BP 116/62 | HR 78 | Ht 60.0 in | Wt 226.0 lb

## 2023-05-31 DIAGNOSIS — I351 Nonrheumatic aortic (valve) insufficiency: Secondary | ICD-10-CM | POA: Diagnosis not present

## 2023-05-31 DIAGNOSIS — R0602 Shortness of breath: Secondary | ICD-10-CM

## 2023-05-31 DIAGNOSIS — R9439 Abnormal result of other cardiovascular function study: Secondary | ICD-10-CM | POA: Diagnosis not present

## 2023-05-31 DIAGNOSIS — I340 Nonrheumatic mitral (valve) insufficiency: Secondary | ICD-10-CM

## 2023-05-31 DIAGNOSIS — R0789 Other chest pain: Secondary | ICD-10-CM | POA: Diagnosis not present

## 2023-05-31 DIAGNOSIS — E782 Mixed hyperlipidemia: Secondary | ICD-10-CM | POA: Diagnosis not present

## 2023-05-31 DIAGNOSIS — R000 Tachycardia, unspecified: Secondary | ICD-10-CM | POA: Diagnosis not present

## 2023-05-31 DIAGNOSIS — Z720 Tobacco use: Secondary | ICD-10-CM | POA: Diagnosis not present

## 2023-05-31 DIAGNOSIS — I259 Chronic ischemic heart disease, unspecified: Secondary | ICD-10-CM | POA: Diagnosis not present

## 2023-05-31 DIAGNOSIS — I10 Essential (primary) hypertension: Secondary | ICD-10-CM

## 2023-05-31 NOTE — Progress Notes (Signed)
 Cardiology Office Note   Date:  05/31/2023   ID:  Sarah Mack, Sarah Mack 1955-08-28, MRN [id number]  PCP:  Marisue Ivan, NP  Cardiologist:  Adrian Blackwater, MD      History of Present Illness: Sarah Mack is a 68 y.o. female who presents for  Chief Complaint  Patient presents with   Follow-up    ECHO/ NST Results    Has pain in hand and shoulder like its numb.      Past Medical History:  Diagnosis Date   (HFpEF) heart failure with preserved ejection fraction (HCC)    a. 10/2015 Echo: EF 55-60%, no rwma, mild to mod AI, nl RV fxn.   Aortic insufficiency    a. 10/2015 Echo: Mild to mod AI.   GERD (gastroesophageal reflux disease)    History of stress test    a. 11/2016 Echo: Small region of mild fixed apical thinning. No ischemia/infarct. Low risk.   Hypertension    Leaky heart valve      Past Surgical History:  Procedure Laterality Date   COLONOSCOPY WITH PROPOFOL N/A 08/06/2017   Procedure: COLONOSCOPY WITH PROPOFOL;  Surgeon: Midge Minium, MD;  Location: [facility] ENDOSCOPY;  Service: Endoscopy;  Laterality: N/A;   COLONOSCOPY WITH PROPOFOL N/A 03/09/2019   Procedure: COLONOSCOPY WITH PROPOFOL;  Surgeon: Toledo, Boykin Nearing, MD;  Location: [facility] ENDOSCOPY;  Service: Gastroenterology;  Laterality: N/A;   NO PAST SURGERIES       Current Outpatient Medications  Medication Sig Dispense Refill   albuterol (VENTOLIN HFA) 108 (90 Base) MCG/ACT inhaler Inhale 1 puff into the lungs as needed for wheezing or shortness of breath.     amLODipine (NORVASC) 5 MG tablet Take 1 tablet (5 mg total) by mouth daily. 30 tablet 11   Aspirin 81 MG CAPS Take 1 capsule by mouth daily.     celecoxib (CELEBREX) 200 MG capsule TAKE 1 CAPSULE BY MOUTH TWICE A DAY 60 capsule 1   cetirizine (ZYRTEC) 10 MG tablet TAKE 1 TABLET BY MOUTH EVERYDAY AT BEDTIME 90 tablet 3   Cholecalciferol (VITAMIN D3) 25 MCG (1000 UT) CAPS Take 1 capsule by mouth daily.     dapagliflozin propanediol (FARXIGA) 10 MG  TABS tablet Take 1 tablet (10 mg total) by mouth daily before breakfast. 30 tablet 3   loratadine (CLARITIN) 10 MG tablet Take 10 mg by mouth every morning.     metoprolol succinate (TOPROL-XL) 50 MG 24 hr tablet TAKE 1 TABLET BY MOUTH NIGHTLY AT BEDTIME FOR BLOOD PRESSURE 90 tablet 3   montelukast (SINGULAIR) 10 MG tablet TAKE 1 TABLET BY MOUTH EVERYDAY AT BEDTIME 90 tablet 3   ramipril (ALTACE) 10 MG capsule Take 1 capsule (10 mg total) by mouth every morning. 90 capsule 2   rosuvastatin (CRESTOR) 20 MG tablet Take 1 tablet (20 mg total) by mouth daily. 90 tablet 3   spironolactone (ALDACTONE) 25 MG tablet TAKE 1 TABLET (25 MG TOTAL) BY MOUTH DAILY. 90 tablet 0   triamcinolone ointment (KENALOG) 0.5 % Apply 1 Application topically 2 (two) times daily. 30 g 0   No current facility-administered medications for this visit.    Allergies:   Patient has no known allergies.    Social History:   reports that she has been smoking cigarettes. She has a 5 pack-year smoking history. She has never used smokeless tobacco. She reports that she does not drink alcohol and does not use drugs.   Family History:  family history includes Alcoholism  in her brother; Diabetes in her father; Heart failure in her father; Hypertension in her brother, brother, brother, and sister; Leukemia in her sister; Liver cancer in her mother.    ROS:     Review of Systems  Constitutional: Negative.   HENT: Negative.    Eyes: Negative.   Respiratory: Negative.    Gastrointestinal: Negative.   Genitourinary: Negative.   Musculoskeletal: Negative.   Skin: Negative.   Neurological: Negative.   Endo/Heme/Allergies: Negative.   Psychiatric/Behavioral: Negative.    All other systems reviewed and are negative.     All other systems are reviewed and negative.    PHYSICAL EXAM: VS:  BP 116/62   Pulse 78   Ht 5' (1.524 m)   Wt 226 lb (102.5 kg)   LMP  (LMP Unknown)   SpO2 (!) 89%   BMI 44.14 kg/m  , BMI Body mass  index is 44.14 kg/m. Last weight:  Wt Readings from Last 3 Encounters:  05/31/23 226 lb (102.5 kg)  04/19/23 231 lb 6.4 oz (105 kg)  03/15/23 232 lb 9.4 oz (105.5 kg)     Physical Exam Constitutional:      Appearance: Normal appearance.  Cardiovascular:     Rate and Rhythm: Normal rate and regular rhythm.     Heart sounds: Normal heart sounds.  Pulmonary:     Effort: Pulmonary effort is normal.     Breath sounds: Normal breath sounds.  Musculoskeletal:     Right lower leg: No edema.     Left lower leg: No edema.  Neurological:     Mental Status: She is alert.       EKG:   Recent Labs: 07/27/2022: Hemoglobin 13.8; Platelets 198 01/28/2023: ALT 9; BUN 18; Creatinine, Ser 1.40; Potassium 5.1; Sodium 142; TSH 1.910    Lipid Panel    Component Value Date/Time   CHOL 125 01/28/2023 0928   TRIG 65 01/28/2023 0928   HDL 59 01/28/2023 0928   CHOLHDL 2.1 01/28/2023 0928   LDLCALC 52 01/28/2023 0928      Other studies Reviewed: Additional studies/ records that were reviewed today include:  Review of the above records demonstrates:      11/29/2016    9:38 AM 05/16/2016    1:27 PM  PAD Screen  Previous PAD dx? No No  Previous surgical procedure? No No  Pain with walking? No No  Feet/toe relief with dangling? Yes Yes  Painful, non-healing ulcers? No No  Extremities discolored? No No      ASSESSMENT AND PLAN:    ICD-10-CM   1. Nonrheumatic aortic valve insufficiency  I35.1 CT CORONARY MORPH W/CTA COR W/SCORE W/CA W/CM &/OR WO/CM    Comprehensive metabolic panel    2. Nonrheumatic mitral valve regurgitation  I34.0 CT CORONARY MORPH W/CTA COR W/SCORE W/CA W/CM &/OR WO/CM    Comprehensive metabolic panel    3. Primary hypertension  I10 CT CORONARY MORPH W/CTA COR W/SCORE W/CA W/CM &/OR WO/CM    Comprehensive metabolic panel    4. Tobacco use  Z72.0 CT CORONARY MORPH W/CTA COR W/SCORE W/CA W/CM &/OR WO/CM    Comprehensive metabolic panel    5. Tachycardia  R00.0  CT CORONARY MORPH W/CTA COR W/SCORE W/CA W/CM &/OR WO/CM    Comprehensive metabolic panel    6. Mixed hyperlipidemia  E78.2 CT CORONARY MORPH W/CTA COR W/SCORE W/CA W/CM &/OR WO/CM    Comprehensive metabolic panel    7. SOB (shortness of breath)  R06.02 CT CORONARY MORPH  W/CTA COR W/SCORE W/CA W/CM &/OR WO/CM    Comprehensive metabolic panel   Has SOB, and chest pain with mostly arm pain, moderate to severe MR/AR, advise ccta as has anteroseptal reversible defect on nuclear stress test    8. Chest pain due to myocardial ischemia, unspecified ischemic chest pain type  I25.9 CT CORONARY MORPH W/CTA COR W/SCORE W/CA W/CM &/OR WO/CM    Comprehensive metabolic panel    9. Abnormal nuclear stress test  R94.39 CT CORONARY MORPH W/CTA COR W/SCORE W/CA W/CM &/OR WO/CM    Comprehensive metabolic panel    10. Other chest pain  R07.89 CT CORONARY MORPH W/CTA COR W/SCORE W/CA W/CM &/OR WO/CM    Comprehensive metabolic panel       Problem List Items Addressed This Visit       Cardiovascular and Mediastinum   Aortic insufficiency - Primary (Chronic)   Relevant Orders   CT CORONARY MORPH W/CTA COR W/SCORE W/CA W/CM &/OR WO/CM   Comprehensive metabolic panel   Nonrheumatic mitral valve regurgitation   Relevant Orders   CT CORONARY MORPH W/CTA COR W/SCORE W/CA W/CM &/OR WO/CM   Comprehensive metabolic panel   Primary hypertension   Relevant Orders   CT CORONARY MORPH W/CTA COR W/SCORE W/CA W/CM &/OR WO/CM   Comprehensive metabolic panel     Other   Tobacco use (Chronic)   Relevant Orders   CT CORONARY MORPH W/CTA COR W/SCORE W/CA W/CM &/OR WO/CM   Comprehensive metabolic panel   Tachycardia (Chronic)   Relevant Orders   CT CORONARY MORPH W/CTA COR W/SCORE W/CA W/CM &/OR WO/CM   Comprehensive metabolic panel   Mixed hyperlipidemia   Relevant Orders   CT CORONARY MORPH W/CTA COR W/SCORE W/CA W/CM &/OR WO/CM   Comprehensive metabolic panel   Other Visit Diagnoses       SOB (shortness  of breath)       Has SOB, and chest pain with mostly arm pain, moderate to severe MR/AR, advise ccta as has anteroseptal reversible defect on nuclear stress test   Relevant Orders   CT CORONARY MORPH W/CTA COR W/SCORE W/CA W/CM &/OR WO/CM   Comprehensive metabolic panel     Chest pain due to myocardial ischemia, unspecified ischemic chest pain type       Relevant Orders   CT CORONARY MORPH W/CTA COR W/SCORE W/CA W/CM &/OR WO/CM   Comprehensive metabolic panel     Abnormal nuclear stress test       Relevant Orders   CT CORONARY MORPH W/CTA COR W/SCORE W/CA W/CM &/OR WO/CM   Comprehensive metabolic panel     Other chest pain       Relevant Orders   CT CORONARY MORPH W/CTA COR W/SCORE W/CA W/CM &/OR WO/CM   Comprehensive metabolic panel          Disposition:   Return in about 4 weeks (around 06/28/2023) for ccta and f/u.    Total time spent: 30 minutes  Signed,  Adrian Blackwater, MD  05/31/2023 10:11 AM    [facility]

## 2023-06-07 ENCOUNTER — Other Ambulatory Visit

## 2023-06-07 DIAGNOSIS — R000 Tachycardia, unspecified: Secondary | ICD-10-CM | POA: Diagnosis not present

## 2023-06-07 DIAGNOSIS — I340 Nonrheumatic mitral (valve) insufficiency: Secondary | ICD-10-CM | POA: Diagnosis not present

## 2023-06-07 DIAGNOSIS — Z720 Tobacco use: Secondary | ICD-10-CM | POA: Diagnosis not present

## 2023-06-07 DIAGNOSIS — I351 Nonrheumatic aortic (valve) insufficiency: Secondary | ICD-10-CM | POA: Diagnosis not present

## 2023-06-07 DIAGNOSIS — I10 Essential (primary) hypertension: Secondary | ICD-10-CM | POA: Diagnosis not present

## 2023-06-08 LAB — COMPREHENSIVE METABOLIC PANEL
ALT: 14 IU/L (ref 0–32)
AST: 17 IU/L (ref 0–40)
Albumin: 4.1 g/dL (ref 3.9–4.9)
Alkaline Phosphatase: 142 IU/L — ABNORMAL HIGH (ref 44–121)
BUN/Creatinine Ratio: 15 (ref 12–28)
BUN: 22 mg/dL (ref 8–27)
Bilirubin Total: 0.5 mg/dL (ref 0.0–1.2)
CO2: 22 mmol/L (ref 20–29)
Calcium: 9.5 mg/dL (ref 8.7–10.3)
Chloride: 104 mmol/L (ref 96–106)
Creatinine, Ser: 1.43 mg/dL — ABNORMAL HIGH (ref 0.57–1.00)
Globulin, Total: 3.4 g/dL (ref 1.5–4.5)
Glucose: 101 mg/dL — ABNORMAL HIGH (ref 70–99)
Potassium: 4.8 mmol/L (ref 3.5–5.2)
Sodium: 140 mmol/L (ref 134–144)
Total Protein: 7.5 g/dL (ref 6.0–8.5)
eGFR: 40 mL/min/{1.73_m2} — ABNORMAL LOW (ref >59)

## 2023-06-11 ENCOUNTER — Ambulatory Visit (INDEPENDENT_AMBULATORY_CARE_PROVIDER_SITE_OTHER)

## 2023-06-11 ENCOUNTER — Other Ambulatory Visit: Payer: Self-pay | Admitting: Cardiology

## 2023-06-11 ENCOUNTER — Other Ambulatory Visit: Payer: Self-pay | Admitting: Cardiovascular Disease

## 2023-06-11 DIAGNOSIS — E782 Mixed hyperlipidemia: Secondary | ICD-10-CM

## 2023-06-11 DIAGNOSIS — R0789 Other chest pain: Secondary | ICD-10-CM

## 2023-06-11 DIAGNOSIS — R000 Tachycardia, unspecified: Secondary | ICD-10-CM

## 2023-06-11 DIAGNOSIS — I259 Chronic ischemic heart disease, unspecified: Secondary | ICD-10-CM

## 2023-06-11 DIAGNOSIS — I351 Nonrheumatic aortic (valve) insufficiency: Secondary | ICD-10-CM | POA: Diagnosis not present

## 2023-06-11 DIAGNOSIS — Z720 Tobacco use: Secondary | ICD-10-CM

## 2023-06-11 DIAGNOSIS — R9439 Abnormal result of other cardiovascular function study: Secondary | ICD-10-CM

## 2023-06-11 DIAGNOSIS — R072 Precordial pain: Secondary | ICD-10-CM

## 2023-06-11 DIAGNOSIS — I10 Essential (primary) hypertension: Secondary | ICD-10-CM

## 2023-06-11 DIAGNOSIS — R9430 Abnormal result of cardiovascular function study, unspecified: Secondary | ICD-10-CM

## 2023-06-11 DIAGNOSIS — R0602 Shortness of breath: Secondary | ICD-10-CM

## 2023-06-11 DIAGNOSIS — I340 Nonrheumatic mitral (valve) insufficiency: Secondary | ICD-10-CM

## 2023-06-11 IMAGING — CR DG CHEST 2V
1 series · 2 of 2 positions shown · non-contrast
Comparison: November 21, 2016

CLINICAL DATA: Chest pain

EXAM:
CHEST - 2 VIEW

[Series 1: dg chest 2 view · 0.14mm/px · 2 of 2 slices shown]
[im 1/2]
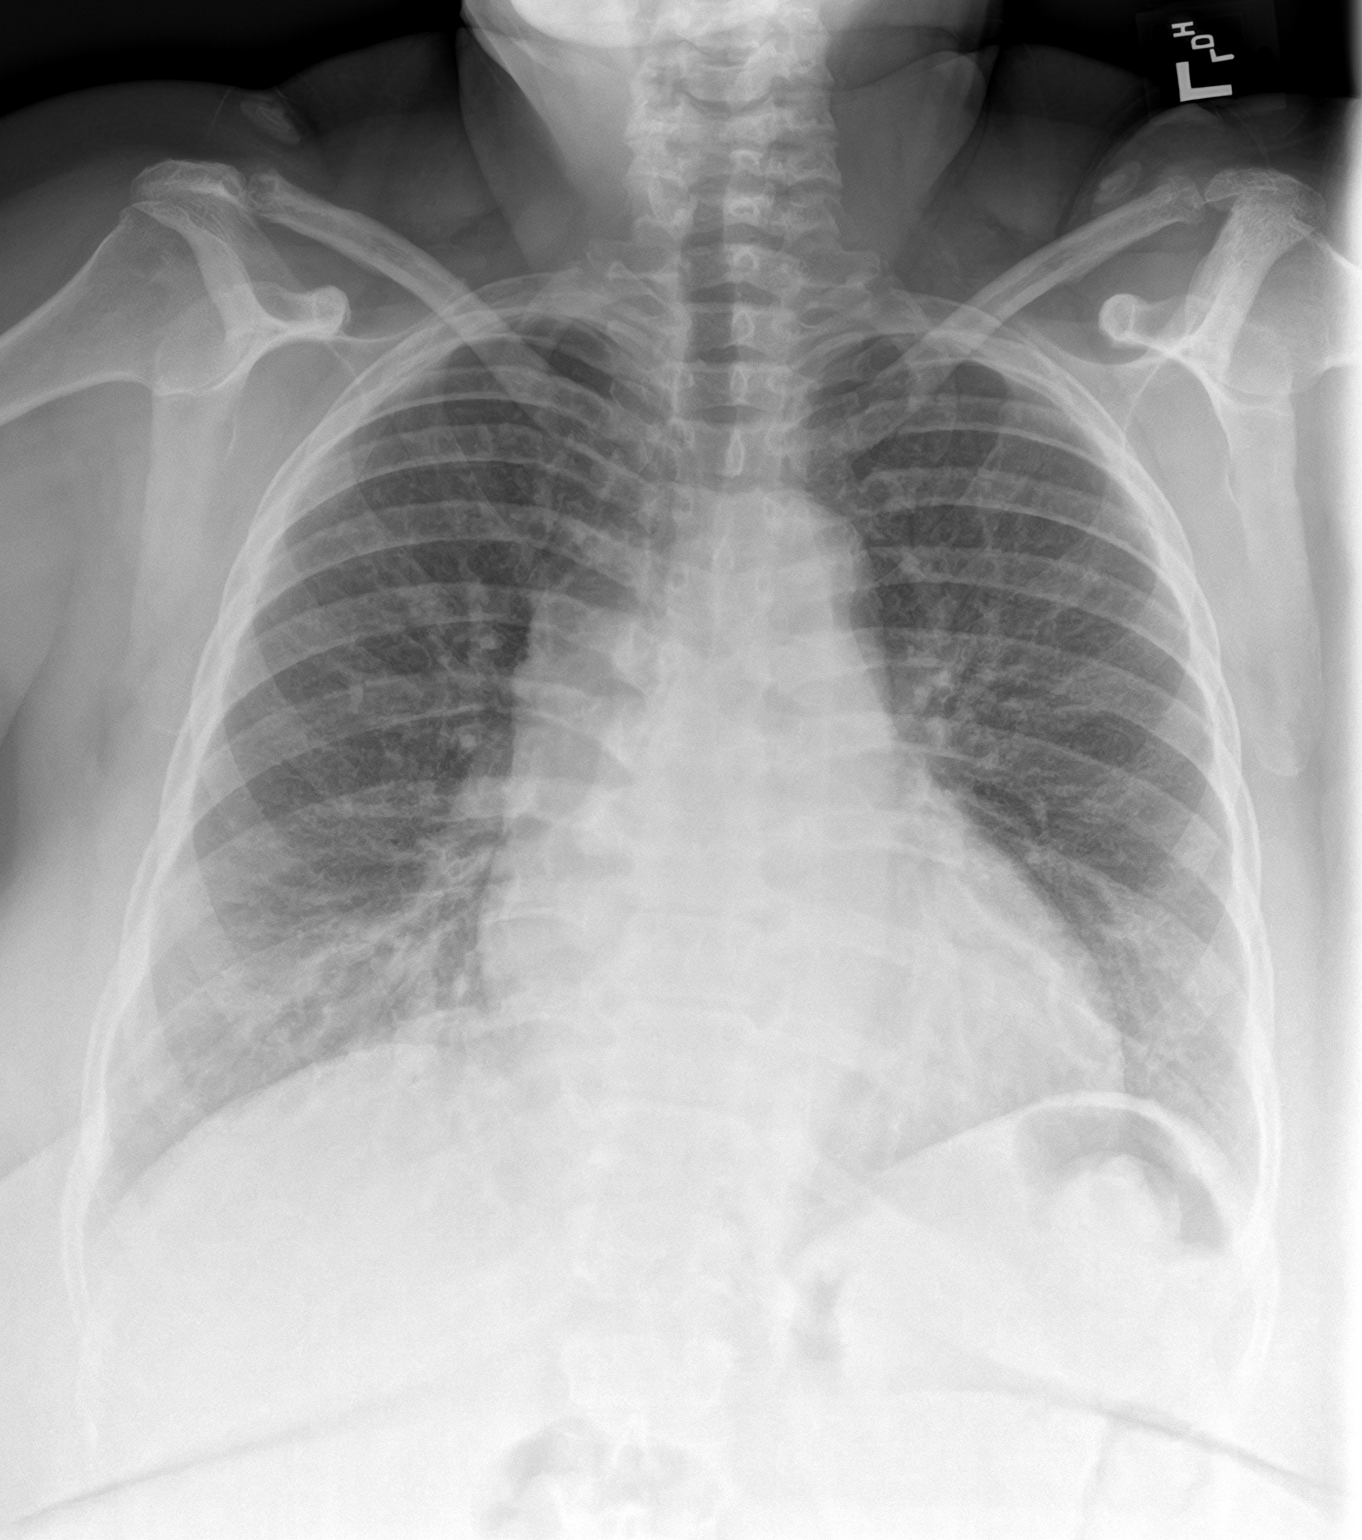
[im 2/2]
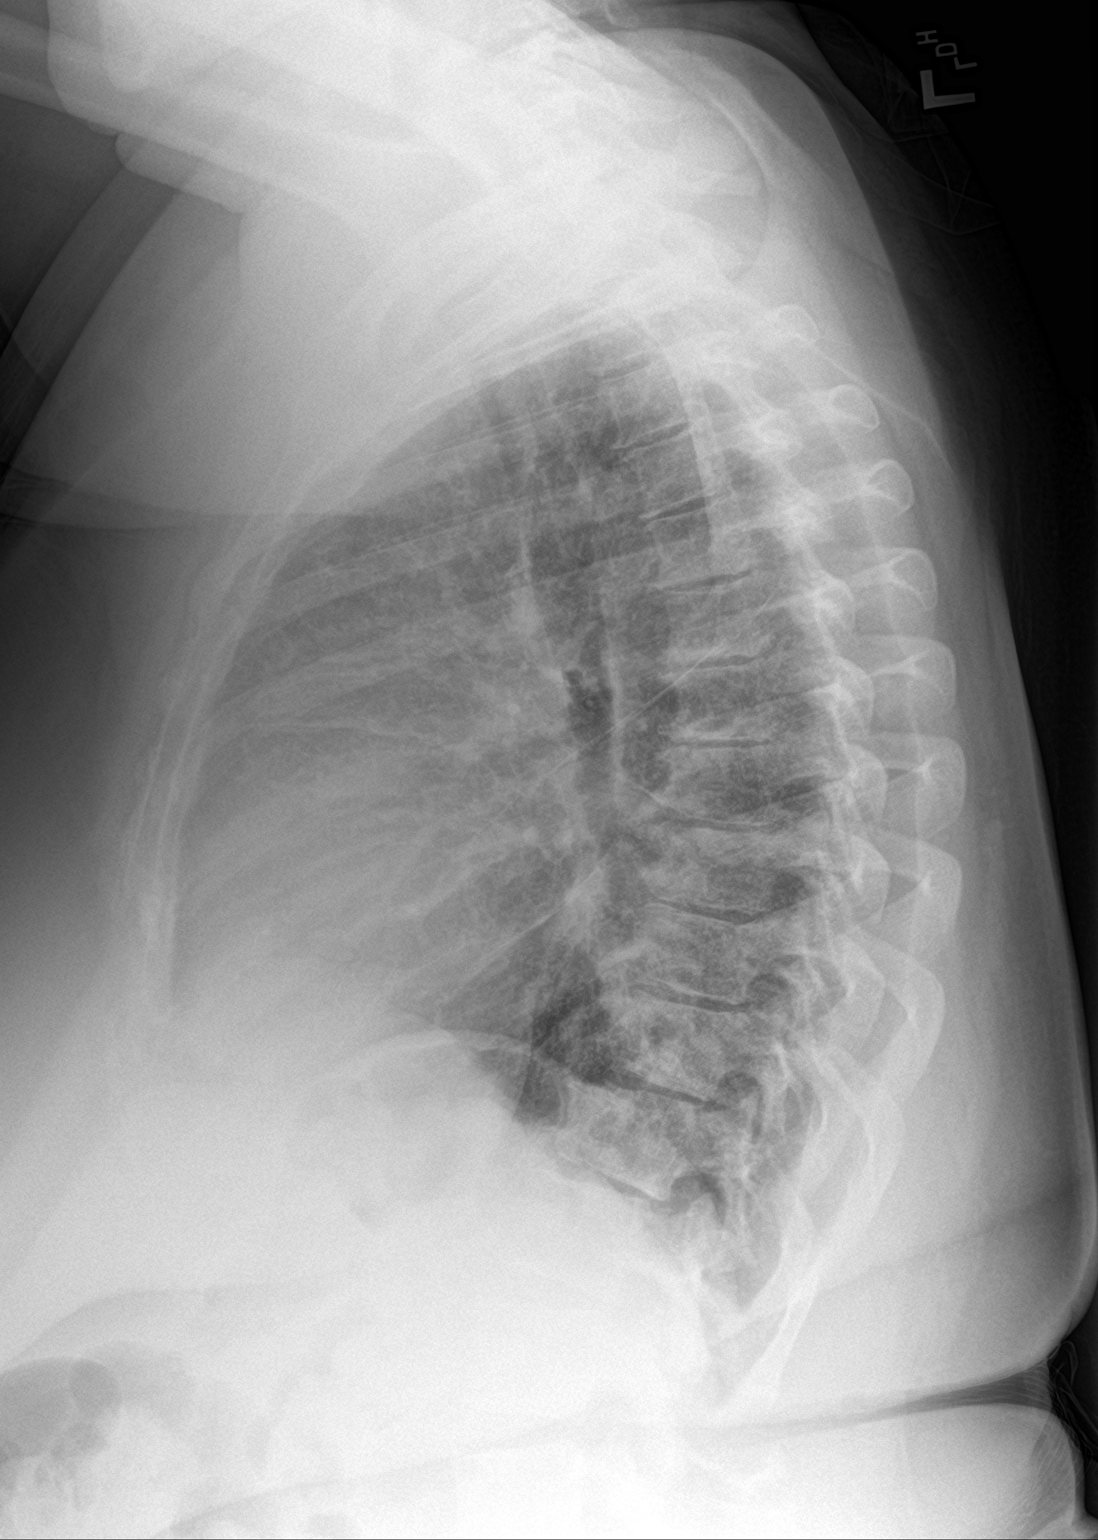

[2 of 2 positions shown; findings below may reference images not displayed]

FINDINGS: There is mild patchy opacity of the right lung base. There is no
pulmonary edema or pleural effusion. The mediastinal contour and
cardiac silhouette are normal. The osseous structures are stable.
IMPRESSION: Mild patchy opacity of the right lung base, developing pneumonia is
not excluded.

## 2023-06-18 ENCOUNTER — Other Ambulatory Visit: Payer: Self-pay | Admitting: Cardiovascular Disease

## 2023-06-28 ENCOUNTER — Encounter: Payer: Self-pay | Admitting: Cardiovascular Disease

## 2023-06-28 ENCOUNTER — Ambulatory Visit (INDEPENDENT_AMBULATORY_CARE_PROVIDER_SITE_OTHER): Admitting: Cardiovascular Disease

## 2023-06-28 ENCOUNTER — Ambulatory Visit: Admitting: Cardiovascular Disease

## 2023-06-28 VITALS — BP 118/70 | HR 59 | Ht 59.0 in | Wt 227.0 lb

## 2023-06-28 DIAGNOSIS — I340 Nonrheumatic mitral (valve) insufficiency: Secondary | ICD-10-CM

## 2023-06-28 DIAGNOSIS — I10 Essential (primary) hypertension: Secondary | ICD-10-CM

## 2023-06-28 DIAGNOSIS — R0602 Shortness of breath: Secondary | ICD-10-CM

## 2023-06-28 DIAGNOSIS — I351 Nonrheumatic aortic (valve) insufficiency: Secondary | ICD-10-CM | POA: Diagnosis not present

## 2023-06-28 DIAGNOSIS — Z720 Tobacco use: Secondary | ICD-10-CM | POA: Diagnosis not present

## 2023-06-28 DIAGNOSIS — E782 Mixed hyperlipidemia: Secondary | ICD-10-CM

## 2023-06-28 MED ORDER — TORSEMIDE 10 MG PO TABS: 10.0000 mg | ORAL_TABLET | Freq: Two times a day (BID) | ORAL | 11 refills | Status: DC

## 2023-06-28 NOTE — Progress Notes (Signed)
 Cardiology Office Note   Date:  06/28/2023   ID:  Sarah Mack, DOB December 21, 1955, MRN [id number]  PCP:  Marisue Ivan, NP  Cardiologist:  Adrian Blackwater, MD      History of Present Illness: Sarah Mack is a 68 y.o. female who presents for  Chief Complaint  Patient presents with   Follow-up    4 week follow up CCTA results    Hands get numb, and cannot walk      Past Medical History:  Diagnosis Date   (HFpEF) heart failure with preserved ejection fraction (HCC)    a. 10/2015 Echo: EF 55-60%, no rwma, mild to mod AI, nl RV fxn.   Aortic insufficiency    a. 10/2015 Echo: Mild to mod AI.   GERD (gastroesophageal reflux disease)    History of stress test    a. 11/2016 Echo: Small region of mild fixed apical thinning. No ischemia/infarct. Low risk.   Hypertension    Leaky heart valve      Past Surgical History:  Procedure Laterality Date   COLONOSCOPY WITH PROPOFOL N/A 08/06/2017   Procedure: COLONOSCOPY WITH PROPOFOL;  Surgeon: Midge Minium, MD;  Location: [facility] ENDOSCOPY;  Service: Endoscopy;  Laterality: N/A;   COLONOSCOPY WITH PROPOFOL N/A 03/09/2019   Procedure: COLONOSCOPY WITH PROPOFOL;  Surgeon: Toledo, Boykin Nearing, MD;  Location: [facility] ENDOSCOPY;  Service: Gastroenterology;  Laterality: N/A;   NO PAST SURGERIES       Current Outpatient Medications  Medication Sig Dispense Refill   FLUZONE HIGH-DOSE 0.5 ML injection Inject 0.5 mLs into the muscle once.     torsemide (DEMADEX) 10 MG tablet Take 1 tablet (10 mg total) by mouth 2 (two) times daily. 60 tablet 11   albuterol (VENTOLIN HFA) 108 (90 Base) MCG/ACT inhaler Inhale 1 puff into the lungs as needed for wheezing or shortness of breath.     amLODipine (NORVASC) 5 MG tablet Take 1 tablet (5 mg total) by mouth daily. 30 tablet 11   Aspirin 81 MG CAPS Take 1 capsule by mouth daily.     celecoxib (CELEBREX) 200 MG capsule TAKE 1 CAPSULE BY MOUTH TWICE A DAY 60 capsule 1   cetirizine (ZYRTEC) 10 MG tablet TAKE  1 TABLET BY MOUTH EVERYDAY AT BEDTIME 90 tablet 3   Cholecalciferol (VITAMIN D3) 25 MCG (1000 UT) CAPS Take 1 capsule by mouth daily.     dapagliflozin propanediol (FARXIGA) 10 MG TABS tablet Take 1 tablet (10 mg total) by mouth daily before breakfast. 30 tablet 3   loratadine (CLARITIN) 10 MG tablet Take 10 mg by mouth every morning.     metoprolol succinate (TOPROL-XL) 50 MG 24 hr tablet TAKE 1 TABLET BY MOUTH NIGHTLY AT BEDTIME FOR BLOOD PRESSURE 90 tablet 3   montelukast (SINGULAIR) 10 MG tablet TAKE 1 TABLET BY MOUTH EVERYDAY AT BEDTIME 90 tablet 3   ramipril (ALTACE) 10 MG capsule TAKE 1 CAPSULE BY MOUTH EVERY DAY IN THE MORNING 90 capsule 2   rosuvastatin (CRESTOR) 20 MG tablet Take 1 tablet (20 mg total) by mouth daily. 90 tablet 3   spironolactone (ALDACTONE) 25 MG tablet TAKE 1 TABLET (25 MG TOTAL) BY MOUTH DAILY. 90 tablet 0   triamcinolone ointment (KENALOG) 0.5 % Apply 1 Application topically 2 (two) times daily. 30 g 0   No current facility-administered medications for this visit.    Allergies:   Patient has no known allergies.    Social History:   reports that she has  been smoking cigarettes. She has a 5 pack-year smoking history. She has never used smokeless tobacco. She reports that she does not drink alcohol and does not use drugs.   Family History:  family history includes Alcoholism in her brother; Diabetes in her father; Heart failure in her father; Hypertension in her brother, brother, brother, and sister; Leukemia in her sister; Liver cancer in her mother.    ROS:     Review of Systems  Constitutional: Negative.   HENT: Negative.    Eyes: Negative.   Respiratory: Negative.    Gastrointestinal: Negative.   Genitourinary: Negative.   Musculoskeletal: Negative.   Skin: Negative.   Neurological: Negative.   Endo/Heme/Allergies: Negative.   Psychiatric/Behavioral: Negative.    All other systems reviewed and are negative.     All other systems are reviewed and  negative.    PHYSICAL EXAM: VS:  BP 118/70   Pulse (!) 59   Ht 4\' 11"  (1.499 m)   Wt 227 lb (103 kg)   LMP  (LMP Unknown)   SpO2 96%   BMI 45.85 kg/m  , BMI Body mass index is 45.85 kg/m. Last weight:  Wt Readings from Last 3 Encounters:  06/28/23 227 lb (103 kg)  05/31/23 226 lb (102.5 kg)  04/19/23 231 lb 6.4 oz (105 kg)     Physical Exam Constitutional:      Appearance: Normal appearance.  Cardiovascular:     Rate and Rhythm: Normal rate and regular rhythm.     Heart sounds: Normal heart sounds.  Pulmonary:     Effort: Pulmonary effort is normal.     Breath sounds: Normal breath sounds.  Musculoskeletal:     Right lower leg: No edema.     Left lower leg: No edema.  Neurological:     Mental Status: She is alert.       EKG:   Recent Labs: 07/27/2022: Hemoglobin 13.8; Platelets 198 01/28/2023: TSH 1.910 06/07/2023: ALT 14; BUN 22; Creatinine, Ser 1.43; Potassium 4.8; Sodium 140    Lipid Panel    Component Value Date/Time   CHOL 125 01/28/2023 0928   TRIG 65 01/28/2023 0928   HDL 59 01/28/2023 0928   CHOLHDL 2.1 01/28/2023 0928   LDLCALC 52 01/28/2023 0928      Other studies Reviewed: Additional studies/ records that were reviewed today include:  Review of the above records demonstrates:      11/29/2016    9:38 AM 05/16/2016    1:27 PM  PAD Screen  Previous PAD dx? No No  Previous surgical procedure? No No  Pain with walking? No No  Feet/toe relief with dangling? Yes Yes  Painful, non-healing ulcers? No No  Extremities discolored? No No      ASSESSMENT AND PLAN:    ICD-10-CM   1. Nonrheumatic aortic valve insufficiency  I35.1 torsemide (DEMADEX) 10 MG tablet   Moderate severe AR    2. Nonrheumatic mitral valve regurgitation  I34.0 torsemide (DEMADEX) 10 MG tablet   moderate to severe MR    3. Primary hypertension  I10 torsemide (DEMADEX) 10 MG tablet    4. Tobacco use  Z72.0 torsemide (DEMADEX) 10 MG tablet    5. Mixed hyperlipidemia   E78.2 torsemide (DEMADEX) 10 MG tablet    6. SOB (shortness of breath)  R06.02 torsemide (DEMADEX) 10 MG tablet   add toresemide 10 bid       Problem List Items Addressed This Visit       Cardiovascular and Mediastinum  Aortic insufficiency - Primary (Chronic)   Relevant Medications   torsemide (DEMADEX) 10 MG tablet   Nonrheumatic mitral valve regurgitation   Relevant Medications   torsemide (DEMADEX) 10 MG tablet   Primary hypertension   Relevant Medications   torsemide (DEMADEX) 10 MG tablet     Other   Tobacco use (Chronic)   Relevant Medications   torsemide (DEMADEX) 10 MG tablet   Mixed hyperlipidemia   Relevant Medications   torsemide (DEMADEX) 10 MG tablet   Other Visit Diagnoses       SOB (shortness of breath)       add toresemide 10 bid   Relevant Medications   torsemide (DEMADEX) 10 MG tablet          Disposition:   Return in about 2 weeks (around 07/12/2023).    Total time spent: 30 minutes  Signed,  Adrian Blackwater, MD  06/28/2023 10:57 AM    [facility]

## 2023-07-13 ENCOUNTER — Other Ambulatory Visit: Payer: Self-pay | Admitting: Cardiovascular Disease

## 2023-07-13 DIAGNOSIS — I5033 Acute on chronic diastolic (congestive) heart failure: Secondary | ICD-10-CM

## 2023-07-15 ENCOUNTER — Ambulatory Visit (INDEPENDENT_AMBULATORY_CARE_PROVIDER_SITE_OTHER): Admitting: Cardiovascular Disease

## 2023-07-15 ENCOUNTER — Encounter: Payer: Self-pay | Admitting: Cardiovascular Disease

## 2023-07-15 VITALS — BP 100/50 | HR 58 | Ht 59.0 in | Wt 220.0 lb

## 2023-07-15 DIAGNOSIS — I5032 Chronic diastolic (congestive) heart failure: Secondary | ICD-10-CM | POA: Diagnosis not present

## 2023-07-15 DIAGNOSIS — Z720 Tobacco use: Secondary | ICD-10-CM

## 2023-07-15 DIAGNOSIS — R0789 Other chest pain: Secondary | ICD-10-CM

## 2023-07-15 DIAGNOSIS — Z0130 Encounter for examination of blood pressure without abnormal findings: Secondary | ICD-10-CM

## 2023-07-15 DIAGNOSIS — I340 Nonrheumatic mitral (valve) insufficiency: Secondary | ICD-10-CM

## 2023-07-15 DIAGNOSIS — R0602 Shortness of breath: Secondary | ICD-10-CM | POA: Diagnosis not present

## 2023-07-15 DIAGNOSIS — I5033 Acute on chronic diastolic (congestive) heart failure: Secondary | ICD-10-CM

## 2023-07-15 DIAGNOSIS — I351 Nonrheumatic aortic (valve) insufficiency: Secondary | ICD-10-CM

## 2023-07-15 MED ORDER — TORSEMIDE 20 MG PO TABS
20.0000 mg | ORAL_TABLET | Freq: Every day | ORAL | 2 refills | Status: DC
Start: 2023-07-15 — End: 2024-01-02

## 2023-07-15 NOTE — Progress Notes (Signed)
 Cardiology Office Note   Date:  07/15/2023   ID:  Sarah Mack, Sarah Mack 21-Mar-1956, MRN [id number]  PCP:  Alica Antu, NP  Cardiologist:  Debborah Fairly, MD      History of Present Illness: Sarah Mack is a 68 y.o. female who presents for  Chief Complaint  Patient presents with   Follow-up    2 weeks follow up    Hands and arm numb, and swelling of legs.      Past Medical History:  Diagnosis Date   (HFpEF) heart failure with preserved ejection fraction (HCC)    a. 10/2015 Echo: EF 55-60%, no rwma, mild to mod AI, nl RV fxn.   Aortic insufficiency    a. 10/2015 Echo: Mild to mod AI.   GERD (gastroesophageal reflux disease)    History of stress test    a. 11/2016 Echo: Small region of mild fixed apical thinning. No ischemia/infarct. Low risk.   Hypertension    Leaky heart valve      Past Surgical History:  Procedure Laterality Date   COLONOSCOPY WITH PROPOFOL  N/A 08/06/2017   Procedure: COLONOSCOPY WITH PROPOFOL ;  Surgeon: Marnee Sink, MD;  Location: [facility] ENDOSCOPY;  Service: Endoscopy;  Laterality: N/A;   COLONOSCOPY WITH PROPOFOL  N/A 03/09/2019   Procedure: COLONOSCOPY WITH PROPOFOL ;  Surgeon: Toledo, Alphonsus Jeans, MD;  Location: [facility] ENDOSCOPY;  Service: Gastroenterology;  Laterality: N/A;   NO PAST SURGERIES       Current Outpatient Medications  Medication Sig Dispense Refill   torsemide  (DEMADEX ) 20 MG tablet Take 1 tablet (20 mg total) by mouth daily. 60 tablet 2   albuterol  (VENTOLIN  HFA) 108 (90 Base) MCG/ACT inhaler Inhale 1 puff into the lungs as needed for wheezing or shortness of breath.     Aspirin 81 MG CAPS Take 1 capsule by mouth daily.     celecoxib  (CELEBREX ) 200 MG capsule TAKE 1 CAPSULE BY MOUTH TWICE A DAY 60 capsule 1   cetirizine (ZYRTEC) 10 MG tablet TAKE 1 TABLET BY MOUTH EVERYDAY AT BEDTIME 90 tablet 3   Cholecalciferol (VITAMIN D3) 25 MCG (1000 UT) CAPS Take 1 capsule by mouth daily.     dapagliflozin  propanediol (FARXIGA ) 10 MG TABS  tablet Take 1 tablet (10 mg total) by mouth daily before breakfast. 30 tablet 3   FLUZONE HIGH-DOSE 0.5 ML injection Inject 0.5 mLs into the muscle once.     loratadine (CLARITIN) 10 MG tablet Take 10 mg by mouth every morning.     metoprolol succinate (TOPROL-XL) 50 MG 24 hr tablet TAKE 1 TABLET BY MOUTH NIGHTLY AT BEDTIME FOR BLOOD PRESSURE 90 tablet 3   montelukast (SINGULAIR) 10 MG tablet TAKE 1 TABLET BY MOUTH EVERYDAY AT BEDTIME 90 tablet 3   ramipril  (ALTACE ) 10 MG capsule TAKE 1 CAPSULE BY MOUTH EVERY DAY IN THE MORNING 90 capsule 2   rosuvastatin  (CRESTOR ) 20 MG tablet Take 1 tablet (20 mg total) by mouth daily. 90 tablet 3   spironolactone  (ALDACTONE ) 25 MG tablet TAKE 1 TABLET (25 MG TOTAL) BY MOUTH DAILY. 90 tablet 0   triamcinolone  ointment (KENALOG ) 0.5 % Apply 1 Application topically 2 (two) times daily. 30 g 0   No current facility-administered medications for this visit.    Allergies:   Patient has no known allergies.    Social History:   reports that she has been smoking cigarettes. She has a 5 pack-year smoking history. She has never used smokeless tobacco. She reports that she does not  drink alcohol and does not use drugs.   Family History:  family history includes Alcoholism in her brother; Diabetes in her father; Heart failure in her father; Hypertension in her brother, brother, brother, and sister; Leukemia in her sister; Liver cancer in her mother.    ROS:     Review of Systems  Constitutional: Negative.   HENT: Negative.    Eyes: Negative.   Respiratory: Negative.    Gastrointestinal: Negative.   Genitourinary: Negative.   Musculoskeletal: Negative.   Skin: Negative.   Neurological: Negative.   Endo/Heme/Allergies: Negative.   Psychiatric/Behavioral: Negative.    All other systems reviewed and are negative.     All other systems are reviewed and negative.    PHYSICAL EXAM: VS:  BP (!) 100/50   Pulse (!) 58   Ht 4\' 11"  (1.499 m)   Wt 220 lb (99.8  kg)   LMP  (LMP Unknown)   SpO2 95%   BMI 44.43 kg/m  , BMI Body mass index is 44.43 kg/m. Last weight:  Wt Readings from Last 3 Encounters:  07/15/23 220 lb (99.8 kg)  06/28/23 227 lb (103 kg)  05/31/23 226 lb (102.5 kg)     Physical Exam Constitutional:      Appearance: Normal appearance.  Cardiovascular:     Rate and Rhythm: Normal rate and regular rhythm.     Heart sounds: Normal heart sounds.  Pulmonary:     Effort: Pulmonary effort is normal.     Breath sounds: Normal breath sounds.  Musculoskeletal:     Right lower leg: No edema.     Left lower leg: No edema.  Neurological:     Mental Status: She is alert.       EKG:   Recent Labs: 07/27/2022: Hemoglobin 13.8; Platelets 198 01/28/2023: TSH 1.910 06/07/2023: ALT 14; BUN 22; Creatinine, Ser 1.43; Potassium 4.8; Sodium 140    Lipid Panel    Component Value Date/Time   CHOL 125 01/28/2023 0928   TRIG 65 01/28/2023 0928   HDL 59 01/28/2023 0928   CHOLHDL 2.1 01/28/2023 0928   LDLCALC 52 01/28/2023 0928      Other studies Reviewed: Additional studies/ records that were reviewed today include:  Review of the above records demonstrates:      11/29/2016    9:38 AM 05/16/2016    1:27 PM  PAD Screen  Previous PAD dx? No No  Previous surgical procedure? No No  Pain with walking? No No  Feet/toe relief with dangling? Yes Yes  Painful, non-healing ulcers? No No  Extremities discolored? No No      ASSESSMENT AND PLAN:    ICD-10-CM   1. CHF (congestive heart failure), NYHA class III, acute on chronic, diastolic (HCC)  I50.33 torsemide  (DEMADEX ) 20 MG tablet    2. Other chest pain  R07.89 torsemide  (DEMADEX ) 20 MG tablet    3. Chronic diastolic heart failure (HCC)  Z61.09 torsemide  (DEMADEX ) 20 MG tablet    4. SOB (shortness of breath)  R06.02 torsemide  (DEMADEX ) 20 MG tablet    5. Tobacco use  Z72.0 torsemide  (DEMADEX ) 20 MG tablet    6. Nonrheumatic mitral valve regurgitation  I34.0 torsemide   (DEMADEX ) 20 MG tablet   Moderate to severe.    7. Nonrheumatic aortic valve insufficiency  I35.1 torsemide  (DEMADEX ) 20 MG tablet   moderate to severe, but normal lVEF. Stop amlodapine, increase torsemide  20 bid       Problem List Items Addressed This Visit  Cardiovascular and Mediastinum   Chronic diastolic heart failure (HCC) (Chronic)   Relevant Medications   torsemide  (DEMADEX ) 20 MG tablet   Aortic insufficiency (Chronic)   Relevant Medications   torsemide  (DEMADEX ) 20 MG tablet   Nonrheumatic mitral valve regurgitation   Relevant Medications   torsemide  (DEMADEX ) 20 MG tablet     Other   Tobacco use (Chronic)   Relevant Medications   torsemide  (DEMADEX ) 20 MG tablet   Other Visit Diagnoses       CHF (congestive heart failure), NYHA class III, acute on chronic, diastolic (HCC)    -  Primary   Relevant Medications   torsemide  (DEMADEX ) 20 MG tablet     Other chest pain       Relevant Medications   torsemide  (DEMADEX ) 20 MG tablet     SOB (shortness of breath)       Relevant Medications   torsemide  (DEMADEX ) 20 MG tablet          Disposition:   Return in about 2 months (around 09/14/2023).    Total time spent: 45 minutes  Signed,  Debborah Fairly, MD  07/15/2023 3:01 PM    [facility]

## 2023-07-17 ENCOUNTER — Encounter: Payer: Self-pay | Admitting: Cardiology

## 2023-07-23 ENCOUNTER — Other Ambulatory Visit: Payer: Self-pay | Admitting: Cardiovascular Disease

## 2023-07-23 DIAGNOSIS — I5032 Chronic diastolic (congestive) heart failure: Secondary | ICD-10-CM

## 2023-08-01 ENCOUNTER — Ambulatory Visit: Payer: 59 | Admitting: Cardiology

## 2023-08-10 ENCOUNTER — Other Ambulatory Visit: Payer: Self-pay | Admitting: Cardiology

## 2023-08-15 ENCOUNTER — Ambulatory Visit (INDEPENDENT_AMBULATORY_CARE_PROVIDER_SITE_OTHER): Admitting: Cardiovascular Disease

## 2023-08-15 ENCOUNTER — Encounter: Payer: Self-pay | Admitting: Cardiovascular Disease

## 2023-08-15 VITALS — BP 115/70 | HR 50 | Ht 59.0 in | Wt 222.2 lb

## 2023-08-15 DIAGNOSIS — I340 Nonrheumatic mitral (valve) insufficiency: Secondary | ICD-10-CM | POA: Diagnosis not present

## 2023-08-15 DIAGNOSIS — R001 Bradycardia, unspecified: Secondary | ICD-10-CM

## 2023-08-15 DIAGNOSIS — I351 Nonrheumatic aortic (valve) insufficiency: Secondary | ICD-10-CM

## 2023-08-15 DIAGNOSIS — R0789 Other chest pain: Secondary | ICD-10-CM | POA: Diagnosis not present

## 2023-08-15 DIAGNOSIS — I5032 Chronic diastolic (congestive) heart failure: Secondary | ICD-10-CM

## 2023-08-15 DIAGNOSIS — Z720 Tobacco use: Secondary | ICD-10-CM

## 2023-08-15 DIAGNOSIS — R0602 Shortness of breath: Secondary | ICD-10-CM | POA: Diagnosis not present

## 2023-08-15 DIAGNOSIS — R42 Dizziness and giddiness: Secondary | ICD-10-CM | POA: Diagnosis not present

## 2023-08-15 MED ORDER — METOPROLOL SUCCINATE ER 25 MG PO TB24: 25.0000 mg | ORAL_TABLET | Freq: Every day | ORAL | 11 refills | Status: AC

## 2023-08-15 NOTE — Progress Notes (Signed)
 Cardiology Office Note   Date:  08/15/2023   ID:  Sarah Mack, Sarah Mack 1956-01-11, MRN [id number]  PCP:  Alica Antu, NP  Cardiologist:  Debborah Fairly, MD      History of Present Illness: Sarah Mack is a 68 y.o. female who presents for  Chief Complaint  Patient presents with   Follow-up    1 month follow up    Has numbness in hands and arms when walking. No dizziness.      Past Medical History:  Diagnosis Date   (HFpEF) heart failure with preserved ejection fraction (HCC)    a. 10/2015 Echo: EF 55-60%, no rwma, mild to mod AI, nl RV fxn.   Aortic insufficiency    a. 10/2015 Echo: Mild to mod AI.   GERD (gastroesophageal reflux disease)    History of stress test    a. 11/2016 Echo: Small region of mild fixed apical thinning. No ischemia/infarct. Low risk.   Hypertension    Leaky heart valve      Past Surgical History:  Procedure Laterality Date   COLONOSCOPY WITH PROPOFOL  N/A 08/06/2017   Procedure: COLONOSCOPY WITH PROPOFOL ;  Surgeon: Marnee Sink, MD;  Location: [facility];  Service: Endoscopy;  Laterality: N/A;   COLONOSCOPY WITH PROPOFOL  N/A 03/09/2019   Procedure: COLONOSCOPY WITH PROPOFOL ;  Surgeon: Toledo, Alphonsus Jeans, MD;  Location: [facility];  Service: Gastroenterology;  Laterality: N/A;   NO PAST SURGERIES       Current Outpatient Medications  Medication Sig Dispense Refill   albuterol  (VENTOLIN  HFA) 108 (90 Base) MCG/ACT inhaler Inhale 1 puff into the lungs as needed for wheezing or shortness of breath.     Aspirin 81 MG CAPS Take 1 capsule by mouth daily.     celecoxib  (CELEBREX ) 200 MG capsule TAKE 1 CAPSULE BY MOUTH TWICE A DAY 60 capsule 1   cetirizine (ZYRTEC) 10 MG tablet TAKE 1 TABLET BY MOUTH EVERYDAY AT BEDTIME 90 tablet 3   Cholecalciferol (VITAMIN D3) 25 MCG (1000 UT) CAPS Take 1 capsule by mouth daily.     FARXIGA  10 MG TABS tablet TAKE 1 TABLET BY MOUTH DAILY BEFORE BREAKFAST. 30 tablet 3   FLUZONE HIGH-DOSE 0.5 ML  injection Inject 0.5 mLs into the muscle once.     loratadine (CLARITIN) 10 MG tablet Take 10 mg by mouth every morning.     metoprolol succinate (TOPROL XL) 25 MG 24 hr tablet Take 1 tablet (25 mg total) by mouth daily. 30 tablet 11   montelukast (SINGULAIR) 10 MG tablet TAKE 1 TABLET BY MOUTH EVERYDAY AT BEDTIME 90 tablet 3   ramipril  (ALTACE ) 10 MG capsule TAKE 1 CAPSULE BY MOUTH EVERY DAY IN THE MORNING 90 capsule 2   rosuvastatin  (CRESTOR ) 20 MG tablet Take 1 tablet (20 mg total) by mouth daily. 90 tablet 3   spironolactone  (ALDACTONE ) 25 MG tablet TAKE 1 TABLET (25 MG TOTAL) BY MOUTH DAILY. 90 tablet 0   torsemide  (DEMADEX ) 20 MG tablet Take 1 tablet (20 mg total) by mouth daily. 60 tablet 2   triamcinolone  ointment (KENALOG ) 0.5 % Apply 1 Application topically 2 (two) times daily. 30 g 0   No current facility-administered medications for this visit.    Allergies:   Patient has no known allergies.    Social History:   reports that she has been smoking cigarettes. She has a 5 pack-year smoking history. She has never used smokeless tobacco. She reports that she does not drink alcohol and does  not use drugs.   Family History:  family history includes Alcoholism in her brother; Diabetes in her father; Heart failure in her father; Hypertension in her brother, brother, brother, and sister; Leukemia in her sister; Liver cancer in her mother.    ROS:     Review of Systems  Constitutional: Negative.   HENT: Negative.    Eyes: Negative.   Respiratory: Negative.    Gastrointestinal: Negative.   Genitourinary: Negative.   Musculoskeletal: Negative.   Skin: Negative.   Neurological: Negative.   Endo/Heme/Allergies: Negative.   Psychiatric/Behavioral: Negative.    All other systems reviewed and are negative.     All other systems are reviewed and negative.    PHYSICAL EXAM: VS:  BP 115/70   Pulse (!) 50   Ht 4\' 11"  (1.499 m)   Wt 222 lb 3.2 oz (100.8 kg)   LMP  (LMP Unknown)    SpO2 94%   BMI 44.88 kg/m  , BMI Body mass index is 44.88 kg/m. Last weight:  Wt Readings from Last 3 Encounters:  08/15/23 222 lb 3.2 oz (100.8 kg)  07/15/23 220 lb (99.8 kg)  06/28/23 227 lb (103 kg)     Physical Exam Constitutional:      Appearance: Normal appearance.  Cardiovascular:     Rate and Rhythm: Normal rate and regular rhythm.     Heart sounds: Normal heart sounds.  Pulmonary:     Effort: Pulmonary effort is normal.     Breath sounds: Normal breath sounds.  Musculoskeletal:     Right lower leg: No edema.     Left lower leg: No edema.  Neurological:     Mental Status: She is alert.       EKG:   Recent Labs: 01/28/2023: TSH 1.910 06/07/2023: ALT 14; BUN 22; Creatinine, Ser 1.43; Potassium 4.8; Sodium 140    Lipid Panel    Component Value Date/Time   CHOL 125 01/28/2023 0928   TRIG 65 01/28/2023 0928   HDL 59 01/28/2023 0928   CHOLHDL 2.1 01/28/2023 0928   LDLCALC 52 01/28/2023 0928      Other studies Reviewed: Additional studies/ records that were reviewed today include:  Review of the above records demonstrates:      11/29/2016    9:38 AM 05/16/2016    1:27 PM  PAD Screen  Previous PAD dx? No No  Previous surgical procedure? No No  Pain with walking? No No  Feet/toe relief with dangling? Yes Yes  Painful, non-healing ulcers? No No  Extremities discolored? No No      ASSESSMENT AND PLAN:    ICD-10-CM   1. Sinus bradycardia  R00.1 metoprolol succinate (TOPROL XL) 25 MG 24 hr tablet   Decrease metoprolol to 25 mg dily from 50 mg.    2. Tobacco use  Z72.0 metoprolol succinate (TOPROL XL) 25 MG 24 hr tablet    3. SOB (shortness of breath)  R06.02 metoprolol succinate (TOPROL XL) 25 MG 24 hr tablet    4. Chronic diastolic heart failure (HCC)  Z61.09 metoprolol succinate (TOPROL XL) 25 MG 24 hr tablet    5. Other chest pain  R07.89 metoprolol succinate (TOPROL XL) 25 MG 24 hr tablet    6. Nonrheumatic mitral valve regurgitation  I34.0  metoprolol succinate (TOPROL XL) 25 MG 24 hr tablet    7. Nonrheumatic aortic valve insufficiency  I35.1 metoprolol succinate (TOPROL XL) 25 MG 24 hr tablet    8. Dizziness  R42 metoprolol succinate (TOPROL XL)  25 MG 24 hr tablet       Problem List Items Addressed This Visit       Cardiovascular and Mediastinum   Chronic diastolic heart failure (HCC) (Chronic)   Relevant Medications   metoprolol succinate (TOPROL XL) 25 MG 24 hr tablet   Aortic insufficiency (Chronic)   Relevant Medications   metoprolol succinate (TOPROL XL) 25 MG 24 hr tablet   Nonrheumatic mitral valve regurgitation   Relevant Medications   metoprolol succinate (TOPROL XL) 25 MG 24 hr tablet     Other   Tobacco use (Chronic)   Relevant Medications   metoprolol succinate (TOPROL XL) 25 MG 24 hr tablet   Other Visit Diagnoses       Sinus bradycardia    -  Primary   Decrease metoprolol to 25 mg dily from 50 mg.   Relevant Medications   metoprolol succinate (TOPROL XL) 25 MG 24 hr tablet     SOB (shortness of breath)       Relevant Medications   metoprolol succinate (TOPROL XL) 25 MG 24 hr tablet     Other chest pain       Relevant Medications   metoprolol succinate (TOPROL XL) 25 MG 24 hr tablet     Dizziness       Relevant Medications   metoprolol succinate (TOPROL XL) 25 MG 24 hr tablet          Disposition:   Return in about 2 months (around 10/15/2023).    Total time spent: 30 minutes  Signed,  Debborah Fairly, MD  08/15/2023 1:51 PM    [facility]

## 2023-09-13 ENCOUNTER — Other Ambulatory Visit

## 2023-09-13 DIAGNOSIS — E782 Mixed hyperlipidemia: Secondary | ICD-10-CM

## 2023-09-13 DIAGNOSIS — R7303 Prediabetes: Secondary | ICD-10-CM | POA: Diagnosis not present

## 2023-09-13 DIAGNOSIS — E669 Obesity, unspecified: Secondary | ICD-10-CM

## 2023-09-13 DIAGNOSIS — I10 Essential (primary) hypertension: Secondary | ICD-10-CM | POA: Diagnosis not present

## 2023-09-14 LAB — LIPID PANEL
Chol/HDL Ratio: 2.7 ratio (ref 0.0–4.4)
Cholesterol, Total: 126 mg/dL (ref 100–199)
HDL: 47 mg/dL (ref >39)
LDL Chol Calc (NIH): 66 mg/dL (ref 0–99)
Triglycerides: 62 mg/dL (ref 0–149)
VLDL Cholesterol Cal: 13 mg/dL (ref 5–40)

## 2023-09-14 LAB — CMP14+EGFR
ALT: 16 IU/L (ref 0–32)
AST: 23 IU/L (ref 0–40)
Albumin: 3.9 g/dL (ref 3.9–4.9)
Alkaline Phosphatase: 130 IU/L — ABNORMAL HIGH (ref 44–121)
BUN/Creatinine Ratio: 15 (ref 12–28)
BUN: 30 mg/dL — ABNORMAL HIGH (ref 8–27)
Bilirubin Total: 0.4 mg/dL (ref 0.0–1.2)
CO2: 16 mmol/L — ABNORMAL LOW (ref 20–29)
Calcium: 9.4 mg/dL (ref 8.7–10.3)
Chloride: 105 mmol/L (ref 96–106)
Creatinine, Ser: 1.98 mg/dL — ABNORMAL HIGH (ref 0.57–1.00)
Globulin, Total: 3.3 g/dL (ref 1.5–4.5)
Glucose: 91 mg/dL (ref 70–99)
Potassium: 5.2 mmol/L (ref 3.5–5.2)
Sodium: 139 mmol/L (ref 134–144)
Total Protein: 7.2 g/dL (ref 6.0–8.5)
eGFR: 27 mL/min/{1.73_m2} — ABNORMAL LOW (ref >59)

## 2023-09-14 LAB — TSH: TSH: 2.43 u[IU]/mL (ref 0.450–4.500)

## 2023-09-14 LAB — HEMOGLOBIN A1C
Est. average glucose Bld gHb Est-mCnc: 131 mg/dL
Hgb A1c MFr Bld: 6.2 % — ABNORMAL HIGH (ref 4.8–5.6)

## 2023-09-16 ENCOUNTER — Ambulatory Visit: Payer: Self-pay | Admitting: Cardiology

## 2023-09-17 ENCOUNTER — Other Ambulatory Visit: Payer: Self-pay | Admitting: Family

## 2023-09-17 DIAGNOSIS — J309 Allergic rhinitis, unspecified: Secondary | ICD-10-CM

## 2023-09-19 ENCOUNTER — Ambulatory Visit (INDEPENDENT_AMBULATORY_CARE_PROVIDER_SITE_OTHER): Admitting: Cardiology

## 2023-09-19 ENCOUNTER — Encounter: Payer: Self-pay | Admitting: Cardiology

## 2023-09-19 VITALS — BP 110/70 | HR 67 | Ht 59.0 in | Wt 221.2 lb

## 2023-09-19 DIAGNOSIS — R29898 Other symptoms and signs involving the musculoskeletal system: Secondary | ICD-10-CM | POA: Diagnosis not present

## 2023-09-19 DIAGNOSIS — N184 Chronic kidney disease, stage 4 (severe): Secondary | ICD-10-CM | POA: Insufficient documentation

## 2023-09-19 DIAGNOSIS — E782 Mixed hyperlipidemia: Secondary | ICD-10-CM

## 2023-09-19 DIAGNOSIS — R7303 Prediabetes: Secondary | ICD-10-CM

## 2023-09-19 DIAGNOSIS — M25542 Pain in joints of left hand: Secondary | ICD-10-CM | POA: Diagnosis not present

## 2023-09-19 DIAGNOSIS — M25541 Pain in joints of right hand: Secondary | ICD-10-CM | POA: Diagnosis not present

## 2023-09-19 DIAGNOSIS — I10 Essential (primary) hypertension: Secondary | ICD-10-CM

## 2023-09-19 MED ORDER — TRIAMCINOLONE ACETONIDE 0.5 % EX OINT: 1.0000 | TOPICAL_OINTMENT | Freq: Two times a day (BID) | CUTANEOUS | 0 refills | Status: DC

## 2023-09-19 NOTE — Progress Notes (Signed)
 Established Patient Office Visit  Subjective:  Patient ID: Sarah Mack, female    DOB: Sep 11, 1955  Age: 68 y.o. MRN: [id number]  Chief Complaint  Patient presents with   Follow-up    Patient in office for 6 month follow up, discuss recent lab results. Patient complaining of bilateral hand pain, arm pain and weakness. Has been taking Celebrex  with no relief. Will do a rheumatoid arthritis panel.  Also complaining of bilateral leg weakness, will refer to physical therapy.  Complaining of rash on the back of her neck. Has Kenalog  cream, will refill.  Discussed recent lab work. Kidney function decreased, will refer to nephrology.     No other concerns at this time.   Past Medical History:  Diagnosis Date   (HFpEF) heart failure with preserved ejection fraction (HCC)    a. 10/2015 Echo: EF 55-60%, no rwma, mild to mod AI, nl RV fxn.   Aortic insufficiency    a. 10/2015 Echo: Mild to mod AI.   GERD (gastroesophageal reflux disease)    History of stress test    a. 11/2016 Echo: Small region of mild fixed apical thinning. No ischemia/infarct. Low risk.   Hypertension    Leaky heart valve     Past Surgical History:  Procedure Laterality Date   COLONOSCOPY WITH PROPOFOL  N/A 08/06/2017   Procedure: COLONOSCOPY WITH PROPOFOL ;  Surgeon: Jinny Carmine, MD;  Location: [facility];  Service: Endoscopy;  Laterality: N/A;   COLONOSCOPY WITH PROPOFOL  N/A 03/09/2019   Procedure: COLONOSCOPY WITH PROPOFOL ;  Surgeon: Toledo, Ladell POUR, MD;  Location: [facility];  Service: Gastroenterology;  Laterality: N/A;   NO PAST SURGERIES      Social History   Socioeconomic History   Marital status: Legally Separated    Spouse name: Not on file   Number of children: Not on file   Years of education: Not on file   Highest education level: Not on file  Occupational History   Not on file  Tobacco Use   Smoking status: Every Day    Current packs/day: 0.50    Average packs/day: 0.5 packs/day  for 10.0 years (5.0 ttl pk-yrs)    Types: Cigarettes   Smokeless tobacco: Never  Vaping Use   Vaping status: Never Used  Substance and Sexual Activity   Alcohol use: No   Drug use: No   Sexual activity: Not on file  Other Topics Concern   Not on file  Social History Narrative   Not on file   Social Drivers of Health   Financial Resource Strain: Not on file  Food Insecurity: Not on file  Transportation Needs: Not on file  Physical Activity: Not on file  Stress: Not on file  Social Connections: Not on file  Intimate Partner Violence: Not on file    Family History  Problem Relation Age of Onset   Liver cancer Mother    Heart failure Father    Diabetes Father    Hypertension Sister    Hypertension Brother    Leukemia Sister    Hypertension Brother    Alcoholism Brother    Hypertension Brother    Breast cancer Neg Hx     No Known Allergies  Outpatient Medications Prior to Visit  Medication Sig   albuterol  (VENTOLIN  HFA) 108 (90 Base) MCG/ACT inhaler Inhale 1 puff into the lungs as needed for wheezing or shortness of breath.   celecoxib  (CELEBREX ) 200 MG capsule TAKE 1 CAPSULE BY MOUTH TWICE A DAY   cetirizine (  ZYRTEC) 10 MG tablet TAKE 1 TABLET BY MOUTH EVERYDAY AT BEDTIME   Cholecalciferol (VITAMIN D3) 25 MCG (1000 UT) CAPS Take 1 capsule by mouth daily.   FARXIGA  10 MG TABS tablet TAKE 1 TABLET BY MOUTH DAILY BEFORE BREAKFAST.   metoprolol  succinate (TOPROL  XL) 25 MG 24 hr tablet Take 1 tablet (25 mg total) by mouth daily.   montelukast (SINGULAIR) 10 MG tablet TAKE 1 TABLET BY MOUTH EVERYDAY AT BEDTIME   ramipril  (ALTACE ) 10 MG capsule TAKE 1 CAPSULE BY MOUTH EVERY DAY IN THE MORNING   rosuvastatin  (CRESTOR ) 20 MG tablet Take 1 tablet (20 mg total) by mouth daily.   spironolactone  (ALDACTONE ) 25 MG tablet TAKE 1 TABLET (25 MG TOTAL) BY MOUTH DAILY.   torsemide  (DEMADEX ) 20 MG tablet Take 1 tablet (20 mg total) by mouth daily.   Aspirin 81 MG CAPS Take 1 capsule by  mouth daily. (Patient not taking: Reported on 09/19/2023)   [DISCONTINUED] FLUZONE HIGH-DOSE 0.5 ML injection Inject 0.5 mLs into the muscle once. (Patient not taking: Reported on 09/19/2023)   [DISCONTINUED] loratadine (CLARITIN) 10 MG tablet Take 10 mg by mouth every morning. (Patient not taking: Reported on 09/19/2023)   [DISCONTINUED] triamcinolone  ointment (KENALOG ) 0.5 % Apply 1 Application topically 2 (two) times daily. (Patient not taking: Reported on 09/19/2023)   No facility-administered medications prior to visit.    Review of Systems  Constitutional: Negative.   HENT: Negative.    Eyes: Negative.   Respiratory: Negative.  Negative for shortness of breath.   Cardiovascular: Negative.  Negative for chest pain.  Gastrointestinal: Negative.  Negative for abdominal pain, constipation and diarrhea.  Genitourinary: Negative.   Musculoskeletal:  Positive for joint pain. Negative for myalgias.       Muscle weakness  Skin:  Positive for rash.  Neurological:  Positive for tingling. Negative for dizziness and headaches.  Endo/Heme/Allergies: Negative.   All other systems reviewed and are negative.      Objective:   BP 110/70   Pulse 67   Ht 4' 11 (1.499 m)   Wt 221 lb 3.2 oz (100.3 kg)   LMP  (LMP Unknown)   SpO2 97%   BMI 44.68 kg/m   Vitals:   09/19/23 0945  BP: 110/70  Pulse: 67  Height: 4' 11 (1.499 m)  Weight: 221 lb 3.2 oz (100.3 kg)  SpO2: 97%  BMI (Calculated): 44.65    Physical Exam Vitals and nursing note reviewed.  Constitutional:      Appearance: Normal appearance. She is normal weight.  HENT:     Head: Normocephalic and atraumatic.     Nose: Nose normal.     Mouth/Throat:     Mouth: Mucous membranes are moist.   Eyes:     Extraocular Movements: Extraocular movements intact.     Conjunctiva/sclera: Conjunctivae normal.     Pupils: Pupils are equal, round, and reactive to light.   Neck:    Cardiovascular:     Rate and Rhythm: Normal rate and  regular rhythm.     Pulses: Normal pulses.     Heart sounds: Normal heart sounds.  Pulmonary:     Effort: Pulmonary effort is normal.     Breath sounds: Normal breath sounds.  Abdominal:     General: Abdomen is flat. Bowel sounds are normal.     Palpations: Abdomen is soft.   Musculoskeletal:        General: Normal range of motion.     Cervical back: Normal range  of motion.   Skin:    General: Skin is warm and dry.   Neurological:     General: No focal deficit present.     Mental Status: She is alert and oriented to person, place, and time.   Psychiatric:        Mood and Affect: Mood normal.        Behavior: Behavior normal.        Thought Content: Thought content normal.        Judgment: Judgment normal.      No results found for any visits on 09/19/23.  Recent Results (from the past 2160 hours)  Lipid panel     Status: None   Collection Time: 09/13/23 10:00 AM  Result Value Ref Range   Cholesterol, Total 126 100 - 199 mg/dL   Triglycerides 62 0 - 149 mg/dL   HDL 47 >60 mg/dL   VLDL Cholesterol Cal 13 5 - 40 mg/dL   LDL Chol Calc (NIH) 66 0 - 99 mg/dL   Chol/HDL Ratio 2.7 0.0 - 4.4 ratio    Comment:                                   T. Chol/HDL Ratio                                             Men  Women                               1/2 Avg.Risk  3.4    3.3                                   Avg.Risk  5.0    4.4                                2X Avg.Risk  9.6    7.1                                3X Avg.Risk 23.4   11.0   CMP14+EGFR     Status: Abnormal   Collection Time: 09/13/23 10:00 AM  Result Value Ref Range   Glucose 91 70 - 99 mg/dL   BUN 30 (H) 8 - 27 mg/dL   Creatinine, Ser 8.01 (H) 0.57 - 1.00 mg/dL   eGFR 27 (L) >40 fO/fpw/8.26   BUN/Creatinine Ratio 15 12 - 28   Sodium 139 134 - 144 mmol/L   Potassium 5.2 3.5 - 5.2 mmol/L   Chloride 105 96 - 106 mmol/L   CO2 16 (L) 20 - 29 mmol/L   Calcium  9.4 8.7 - 10.3 mg/dL   Total Protein 7.2 6.0 - 8.5  g/dL   Albumin 3.9 3.9 - 4.9 g/dL   Globulin, Total 3.3 1.5 - 4.5 g/dL   Bilirubin Total 0.4 0.0 - 1.2 mg/dL   Alkaline Phosphatase 130 (H) 44 - 121 IU/L   AST 23 0 - 40 IU/L   ALT 16 0 - 32 IU/L  TSH     Status:  None   Collection Time: 09/13/23 10:00 AM  Result Value Ref Range   TSH 2.430 0.450 - 4.500 uIU/mL  Hemoglobin A1c     Status: Abnormal   Collection Time: 09/13/23 10:00 AM  Result Value Ref Range   Hgb A1c MFr Bld 6.2 (H) 4.8 - 5.6 %    Comment:          Prediabetes: 5.7 - 6.4          Diabetes: >6.4          Glycemic control for adults with diabetes: <7.0    Est. average glucose Bld gHb Est-mCnc 131 mg/dL      Assessment & Plan:  Rheumatoid arthritis panel today Referral sent to physical therapy Kenalog  cream on the back of her neck Referral sent to nephrology  Problem List Items Addressed This Visit       Cardiovascular and Mediastinum   Primary hypertension - Primary     Genitourinary   Stage 4 chronic kidney disease (HCC)   Relevant Orders   Ambulatory referral to Nephrology     Other   Mixed hyperlipidemia   Prediabetes   Other Visit Diagnoses       Arthralgia of both hands       Relevant Orders   Rheumatoid Arthritis Profile     Weakness of both lower extremities       Relevant Orders   Ambulatory referral to Physical Therapy       Return in about 4 weeks (around 10/17/2023).   Total time spent: 25 minutes  [employer], NP  09/19/2023   This document may have been prepared by Dragon Voice Recognition software and as such may include unintentional dictation errors.

## 2023-09-23 LAB — RHEUMATOID ARTHRITIS PROFILE
Cyclic Citrullin Peptide Ab: 12 U (ref 0–19)
Rheumatoid fact SerPl-aCnc: <10 [IU]/mL (ref <14.0)

## 2023-09-24 ENCOUNTER — Ambulatory Visit: Payer: Self-pay | Admitting: Cardiology

## 2023-09-24 NOTE — Telephone Encounter (Signed)
 Patient left VM requesting for you to call her with results. Please advise.

## 2023-09-26 ENCOUNTER — Other Ambulatory Visit: Payer: Self-pay | Admitting: Cardiology

## 2023-09-26 ENCOUNTER — Encounter: Payer: Self-pay | Admitting: Cardiology

## 2023-09-26 DIAGNOSIS — E785 Hyperlipidemia, unspecified: Secondary | ICD-10-CM | POA: Diagnosis not present

## 2023-09-26 DIAGNOSIS — R8290 Unspecified abnormal findings in urine: Secondary | ICD-10-CM | POA: Diagnosis not present

## 2023-09-26 DIAGNOSIS — I509 Heart failure, unspecified: Secondary | ICD-10-CM | POA: Diagnosis not present

## 2023-09-26 DIAGNOSIS — N1832 Chronic kidney disease, stage 3b: Secondary | ICD-10-CM | POA: Diagnosis not present

## 2023-09-26 DIAGNOSIS — I129 Hypertensive chronic kidney disease with stage 1 through stage 4 chronic kidney disease, or unspecified chronic kidney disease: Secondary | ICD-10-CM | POA: Diagnosis not present

## 2023-09-26 MED ORDER — WEGOVY 0.25 MG/0.5ML ~~LOC~~ SOAJ: 0.2500 mg | SUBCUTANEOUS | 4 refills | Status: DC

## 2023-10-02 ENCOUNTER — Other Ambulatory Visit: Payer: Self-pay | Admitting: Nephrology

## 2023-10-02 DIAGNOSIS — I129 Hypertensive chronic kidney disease with stage 1 through stage 4 chronic kidney disease, or unspecified chronic kidney disease: Secondary | ICD-10-CM

## 2023-10-02 DIAGNOSIS — N1832 Chronic kidney disease, stage 3b: Secondary | ICD-10-CM

## 2023-10-02 DIAGNOSIS — R8290 Unspecified abnormal findings in urine: Secondary | ICD-10-CM

## 2023-10-06 ENCOUNTER — Other Ambulatory Visit: Payer: Self-pay | Admitting: Cardiology

## 2023-10-10 ENCOUNTER — Ambulatory Visit
Admission: RE | Admit: 2023-10-10 | Discharge: 2023-10-10 | Disposition: A | Discharge status: Home / Self Care | Source: Ambulatory Visit | Attending: Nephrology | Admitting: Nephrology

## 2023-10-10 DIAGNOSIS — R8290 Unspecified abnormal findings in urine: Secondary | ICD-10-CM | POA: Diagnosis not present

## 2023-10-10 DIAGNOSIS — N1832 Chronic kidney disease, stage 3b: Secondary | ICD-10-CM | POA: Diagnosis not present

## 2023-10-10 DIAGNOSIS — I129 Hypertensive chronic kidney disease with stage 1 through stage 4 chronic kidney disease, or unspecified chronic kidney disease: Secondary | ICD-10-CM | POA: Insufficient documentation

## 2023-10-10 DIAGNOSIS — N189 Chronic kidney disease, unspecified: Secondary | ICD-10-CM | POA: Diagnosis not present

## 2023-10-10 DIAGNOSIS — N281 Cyst of kidney, acquired: Secondary | ICD-10-CM | POA: Diagnosis not present

## 2023-10-17 ENCOUNTER — Ambulatory Visit: Admitting: Cardiovascular Disease

## 2023-10-17 ENCOUNTER — Encounter: Payer: Self-pay | Admitting: Cardiovascular Disease

## 2023-10-17 ENCOUNTER — Other Ambulatory Visit: Payer: Self-pay | Admitting: Cardiovascular Disease

## 2023-10-17 ENCOUNTER — Other Ambulatory Visit

## 2023-10-17 VITALS — BP 112/74 | HR 87 | Ht 59.0 in | Wt 212.0 lb

## 2023-10-17 DIAGNOSIS — Z720 Tobacco use: Secondary | ICD-10-CM

## 2023-10-17 DIAGNOSIS — I351 Nonrheumatic aortic (valve) insufficiency: Secondary | ICD-10-CM

## 2023-10-17 DIAGNOSIS — I10 Essential (primary) hypertension: Secondary | ICD-10-CM

## 2023-10-17 DIAGNOSIS — E782 Mixed hyperlipidemia: Secondary | ICD-10-CM

## 2023-10-17 DIAGNOSIS — I340 Nonrheumatic mitral (valve) insufficiency: Secondary | ICD-10-CM

## 2023-10-17 DIAGNOSIS — I5032 Chronic diastolic (congestive) heart failure: Secondary | ICD-10-CM

## 2023-10-17 MED ORDER — ASPIRIN 81 MG PO CAPS: 1.0000 | ORAL_CAPSULE | Freq: Every day | ORAL | 2 refills | Status: DC

## 2023-10-17 NOTE — Progress Notes (Signed)
 Cardiology Office Note   Date:  10/17/2023   ID:  [name], [name] [birth year], MRN [id number]  PCP:  Carin Gauze, NP  Cardiologist:  Denyse Bathe, MD      History of Present Illness: Sarah Mack is a 68 y.o. female who presents for  Chief Complaint  Patient presents with   Follow-up    2 months follow up    Has arthritis in hands. No chest pain or SOB>      Past Medical History:  Diagnosis Date   (HFpEF) heart failure with preserved ejection fraction (HCC)    a. 10/2015 Echo: EF 55-60%, no rwma, mild to mod AI, nl RV fxn.   Aortic insufficiency    a. 10/2015 Echo: Mild to mod AI.   GERD (gastroesophageal reflux disease)    History of stress test    a. 11/2016 Echo: Small region of mild fixed apical thinning. No ischemia/infarct. Low risk.   Hypertension    Leaky heart valve      Past Surgical History:  Procedure Laterality Date   COLONOSCOPY WITH PROPOFOL  N/A 08/06/2017   Procedure: COLONOSCOPY WITH PROPOFOL ;  Surgeon: Jinny Carmine, MD;  Location: [facility];  Service: Endoscopy;  Laterality: N/A;   COLONOSCOPY WITH PROPOFOL  N/A 03/09/2019   Procedure: COLONOSCOPY WITH PROPOFOL ;  Surgeon: Toledo, Ladell POUR, MD;  Location: [facility];  Service: Gastroenterology;  Laterality: N/A;   NO PAST SURGERIES       Current Outpatient Medications  Medication Sig Dispense Refill   albuterol  (VENTOLIN  HFA) 108 (90 Base) MCG/ACT inhaler Inhale 1 puff into the lungs as needed for wheezing or shortness of breath.     celecoxib  (CELEBREX ) 200 MG capsule TAKE 1 CAPSULE BY MOUTH TWICE A DAY 60 capsule 1   cetirizine (ZYRTEC) 10 MG tablet TAKE 1 TABLET BY MOUTH EVERYDAY AT BEDTIME 90 tablet 3   Cholecalciferol (VITAMIN D3) 25 MCG (1000 UT) CAPS Take 1 capsule by mouth daily.     FARXIGA  10 MG TABS tablet TAKE 1 TABLET BY MOUTH DAILY BEFORE BREAKFAST. 30 tablet 3   metoprolol  succinate (TOPROL  XL) 25 MG 24 hr tablet Take 1 tablet (25 mg total) by mouth daily. 30  tablet 11   montelukast (SINGULAIR) 10 MG tablet TAKE 1 TABLET BY MOUTH EVERYDAY AT BEDTIME 90 tablet 3   ramipril  (ALTACE ) 10 MG capsule TAKE 1 CAPSULE BY MOUTH EVERY DAY IN THE MORNING 90 capsule 2   rosuvastatin  (CRESTOR ) 20 MG tablet Take 1 tablet (20 mg total) by mouth daily. 90 tablet 3   Semaglutide -Weight Management (WEGOVY ) 0.25 MG/0.5ML SOAJ Inject 0.25 mg into the skin once a week. 2 mL 4   spironolactone  (ALDACTONE ) 25 MG tablet TAKE 1 TABLET (25 MG TOTAL) BY MOUTH DAILY. 90 tablet 0   torsemide  (DEMADEX ) 20 MG tablet Take 1 tablet (20 mg total) by mouth daily. 60 tablet 2   triamcinolone  ointment (KENALOG ) 0.5 % Apply 1 Application topically 2 (two) times daily. 30 g 0   Aspirin  81 MG CAPS Take 1 capsule by mouth daily. 30 capsule 2   No current facility-administered medications for this visit.    Allergies:   Patient has no known allergies.    Social History:   reports that she has been smoking cigarettes. She has a 5 pack-year smoking history. She has never used smokeless tobacco. She reports that she does not drink alcohol and does not use drugs.   Family History:  family history includes Alcoholism  in her brother; Diabetes in her father; Heart failure in her father; Hypertension in her brother, brother, brother, and sister; Leukemia in her sister; Liver cancer in her mother.    ROS:     Review of Systems  Constitutional: Negative.   HENT: Negative.    Eyes: Negative.   Respiratory: Negative.    Gastrointestinal: Negative.   Genitourinary: Negative.   Musculoskeletal: Negative.   Skin: Negative.   Neurological: Negative.   Endo/Heme/Allergies: Negative.   Psychiatric/Behavioral: Negative.    All other systems reviewed and are negative.     All other systems are reviewed and negative.    PHYSICAL EXAM: VS:  BP 112/74   Pulse 87   Ht 4' 11 (1.499 m)   Wt 212 lb (96.2 kg)   LMP  (LMP Unknown)   SpO2 96%   BMI 42.82 kg/m  , BMI Body mass index is 42.82  kg/m. Last weight:  Wt Readings from Last 3 Encounters:  10/17/23 212 lb (96.2 kg)  09/19/23 221 lb 3.2 oz (100.3 kg)  08/15/23 222 lb 3.2 oz (100.8 kg)     Physical Exam Constitutional:      Appearance: Normal appearance.  Cardiovascular:     Rate and Rhythm: Normal rate and regular rhythm.     Heart sounds: Normal heart sounds.  Pulmonary:     Effort: Pulmonary effort is normal.     Breath sounds: Normal breath sounds.  Musculoskeletal:     Right lower leg: No edema.     Left lower leg: No edema.  Neurological:     Mental Status: She is alert.       EKG:   Recent Labs: 09/13/2023: ALT 16; BUN 30; Creatinine, Ser 1.98; Potassium 5.2; Sodium 139; TSH 2.430    Lipid Panel    Component Value Date/Time   CHOL 126 09/13/2023 1000   TRIG 62 09/13/2023 1000   HDL 47 09/13/2023 1000   CHOLHDL 2.7 09/13/2023 1000   LDLCALC 66 09/13/2023 1000      Other studies Reviewed: Additional studies/ records that were reviewed today include:  Review of the above records demonstrates:      11/29/2016    9:38 AM 05/16/2016    1:27 PM  PAD Screen  Previous PAD dx? No No  Previous surgical procedure? No No  Pain with walking? No No  Feet/toe relief with dangling? Yes Yes  Painful, non-healing ulcers? No No  Extremities discolored? No No      ASSESSMENT AND PLAN:    ICD-10-CM   1. Chronic diastolic heart failure (HCC)  P49.67 Aspirin  81 MG CAPS   CHF compensated, added asp 81 daily.    2. Nonrheumatic aortic valve insufficiency  I35.1 Aspirin  81 MG CAPS    3. Nonrheumatic mitral valve regurgitation  I34.0 Aspirin  81 MG CAPS    4. Primary hypertension  I10 Aspirin  81 MG CAPS    5. Tobacco use  Z72.0 Aspirin  81 MG CAPS    6. Mixed hyperlipidemia  E78.2 Aspirin  81 MG CAPS       Problem List Items Addressed This Visit       Cardiovascular and Mediastinum   Chronic diastolic heart failure (HCC) - Primary (Chronic)   Relevant Medications   Aspirin  81 MG CAPS    Aortic insufficiency (Chronic)   Relevant Medications   Aspirin  81 MG CAPS   Nonrheumatic mitral valve regurgitation   Relevant Medications   Aspirin  81 MG CAPS   Primary hypertension  Relevant Medications   Aspirin  81 MG CAPS     Other   Tobacco use (Chronic)   Relevant Medications   Aspirin  81 MG CAPS   Mixed hyperlipidemia   Relevant Medications   Aspirin  81 MG CAPS       Disposition:   Return in about 3 months (around 01/17/2024).    Total time spent: 35 minutes  Signed,  Denyse Bathe, MD  10/17/2023 10:23 AM    [facility]

## 2023-10-24 ENCOUNTER — Encounter: Payer: Self-pay | Admitting: Cardiology

## 2023-10-24 ENCOUNTER — Ambulatory Visit (INDEPENDENT_AMBULATORY_CARE_PROVIDER_SITE_OTHER): Admitting: Cardiology

## 2023-10-24 VITALS — BP 117/78 | HR 85 | Ht 59.0 in | Wt 213.8 lb

## 2023-10-24 DIAGNOSIS — R29898 Other symptoms and signs involving the musculoskeletal system: Secondary | ICD-10-CM | POA: Diagnosis not present

## 2023-10-24 DIAGNOSIS — M25541 Pain in joints of right hand: Secondary | ICD-10-CM | POA: Diagnosis not present

## 2023-10-24 DIAGNOSIS — R7303 Prediabetes: Secondary | ICD-10-CM

## 2023-10-24 DIAGNOSIS — Z0130 Encounter for examination of blood pressure without abnormal findings: Secondary | ICD-10-CM

## 2023-10-24 DIAGNOSIS — M25542 Pain in joints of left hand: Secondary | ICD-10-CM

## 2023-10-24 NOTE — Patient Instructions (Signed)
 Voltaren gel Glucosamine chondroitin

## 2023-10-24 NOTE — Progress Notes (Signed)
 Established Patient Office Visit  Subjective:  Patient ID: Sarah Mack, female    DOB: [birth year]  Age: 68 y.o. MRN: [id number]  Chief Complaint  Patient presents with   Follow-up    1 month follow up    Patient in office for one month follow up. Patient has no new complaints today. Patient referred to nephrology at previous visit, had visit with them. Celebrex  stopped by nephrologist. Patient now experiencing worsening joint pain in the hands. Recommend using Voltaren gel. Discuss using glucosamine chondroitin with nephrology at next visit.  Referral sent to physical therapy at previous visit for leg weakness, patient has not heard from them. Will give patient phone to call to schedule.     No other concerns at this time.   Past Medical History:  Diagnosis Date   (HFpEF) heart failure with preserved ejection fraction (HCC)    a. 10/2015 Echo: EF 55-60%, no rwma, mild to mod AI, nl RV fxn.   Aortic insufficiency    a. 10/2015 Echo: Mild to mod AI.   GERD (gastroesophageal reflux disease)    History of stress test    a. 11/2016 Echo: Small region of mild fixed apical thinning. No ischemia/infarct. Low risk.   Hypertension    Leaky heart valve     Past Surgical History:  Procedure Laterality Date   COLONOSCOPY WITH PROPOFOL  N/A 08/06/2017   Procedure: COLONOSCOPY WITH PROPOFOL ;  Surgeon: Jinny Carmine, MD;  Location: [facility];  Service: Endoscopy;  Laterality: N/A;   COLONOSCOPY WITH PROPOFOL  N/A 03/09/2019   Procedure: COLONOSCOPY WITH PROPOFOL ;  Surgeon: Toledo, Ladell POUR, MD;  Location: [facility] ENDOSCOPY;  Service: Gastroenterology;  Laterality: N/A;   NO PAST SURGERIES      Social History   Socioeconomic History   Marital status: Widowed    Spouse name: Not on file   Number of children: Not on file   Years of education: Not on file   Highest education level: Not on file  Occupational History   Not on file  Tobacco Use   Smoking status: Every Day    Current  packs/day: 0.50    Average packs/day: 0.5 packs/day for 10.0 years (5.0 ttl pk-yrs)    Types: Cigarettes   Smokeless tobacco: Never  Vaping Use   Vaping status: Never Used  Substance and Sexual Activity   Alcohol use: No   Drug use: No   Sexual activity: Not on file  Other Topics Concern   Not on file  Social History Narrative   Not on file   Social Drivers of Health   Financial Resource Strain: Not on file  Food Insecurity: Not on file  Transportation Needs: Not on file  Physical Activity: Not on file  Stress: Not on file  Social Connections: Not on file  Intimate Partner Violence: Not on file    Family History  Problem Relation Age of Onset   Liver cancer Mother    Heart failure Father    Diabetes Father    Hypertension Sister    Hypertension Brother    Leukemia Sister    Hypertension Brother    Alcoholism Brother    Hypertension Brother    Breast cancer Neg Hx     No Known Allergies  Outpatient Medications Prior to Visit  Medication Sig   albuterol  (VENTOLIN  HFA) 108 (90 Base) MCG/ACT inhaler Inhale 1 puff into the lungs as needed for wheezing or shortness of breath.   cetirizine (ZYRTEC) 10 MG tablet TAKE 1  TABLET BY MOUTH EVERYDAY AT BEDTIME   Cholecalciferol (VITAMIN D3) 25 MCG (1000 UT) CAPS Take 1 capsule by mouth daily.   FARXIGA  10 MG TABS tablet TAKE 1 TABLET BY MOUTH DAILY BEFORE BREAKFAST.   metoprolol  succinate (TOPROL  XL) 25 MG 24 hr tablet Take 1 tablet (25 mg total) by mouth daily.   montelukast (SINGULAIR) 10 MG tablet TAKE 1 TABLET BY MOUTH EVERYDAY AT BEDTIME   ramipril  (ALTACE ) 10 MG capsule TAKE 1 CAPSULE BY MOUTH EVERY DAY IN THE MORNING   rosuvastatin  (CRESTOR ) 20 MG tablet Take 1 tablet (20 mg total) by mouth daily.   Semaglutide -Weight Management (WEGOVY ) 0.25 MG/0.5ML SOAJ Inject 0.25 mg into the skin once a week.   spironolactone  (ALDACTONE ) 25 MG tablet TAKE 1 TABLET (25 MG TOTAL) BY MOUTH DAILY.   torsemide  (DEMADEX ) 20 MG tablet  Take 1 tablet (20 mg total) by mouth daily.   triamcinolone  ointment (KENALOG ) 0.5 % Apply 1 Application topically 2 (two) times daily.   VAZALORE  81 MG CAPS TAKE 1 CAPSULE BY MOUTH EVERY DAY   celecoxib  (CELEBREX ) 200 MG capsule TAKE 1 CAPSULE BY MOUTH TWICE A DAY (Patient not taking: Reported on 10/24/2023)   No facility-administered medications prior to visit.    Review of Systems  Constitutional: Negative.   HENT: Negative.    Eyes: Negative.   Respiratory: Negative.  Negative for shortness of breath.   Cardiovascular: Negative.  Negative for chest pain.  Gastrointestinal: Negative.  Negative for abdominal pain, constipation and diarrhea.  Genitourinary: Negative.   Musculoskeletal:  Negative for joint pain and myalgias.  Skin: Negative.   Neurological: Negative.  Negative for dizziness and headaches.  Endo/Heme/Allergies: Negative.   All other systems reviewed and are negative.      Objective:   BP 117/78   Pulse 85   Ht 4' 11 (1.499 m)   Wt 213 lb 12.8 oz (97 kg)   LMP  (LMP Unknown)   SpO2 96%   BMI 43.18 kg/m   Vitals:   10/24/23 0924  BP: 117/78  Pulse: 85  Height: 4' 11 (1.499 m)  Weight: 213 lb 12.8 oz (97 kg)  SpO2: 96%  BMI (Calculated): 43.16    Physical Exam Vitals and nursing note reviewed.  Constitutional:      Appearance: Normal appearance. She is normal weight.  HENT:     Head: Normocephalic and atraumatic.     Nose: Nose normal.     Mouth/Throat:     Mouth: Mucous membranes are moist.  Eyes:     Extraocular Movements: Extraocular movements intact.     Conjunctiva/sclera: Conjunctivae normal.     Pupils: Pupils are equal, round, and reactive to light.  Cardiovascular:     Rate and Rhythm: Normal rate and regular rhythm.     Pulses: Normal pulses.     Heart sounds: Normal heart sounds.  Pulmonary:     Effort: Pulmonary effort is normal.     Breath sounds: Normal breath sounds.  Abdominal:     General: Abdomen is flat. Bowel sounds  are normal.     Palpations: Abdomen is soft.  Musculoskeletal:        General: Normal range of motion.     Cervical back: Normal range of motion.  Skin:    General: Skin is warm and dry.  Neurological:     General: No focal deficit present.     Mental Status: She is alert and oriented to person, place, and time.  Psychiatric:  Mood and Affect: Mood normal.        Behavior: Behavior normal.        Thought Content: Thought content normal.        Judgment: Judgment normal.      No results found for any visits on 10/24/23.  Recent Results (from the past 2160 hours)  Lipid panel     Status: None   Collection Time: 09/13/23 10:00 AM  Result Value Ref Range   Cholesterol, Total 126 100 - 199 mg/dL   Triglycerides 62 0 - 149 mg/dL   HDL 47 >60 mg/dL   VLDL Cholesterol Cal 13 5 - 40 mg/dL   LDL Chol Calc (NIH) 66 0 - 99 mg/dL   Chol/HDL Ratio 2.7 0.0 - 4.4 ratio    Comment:                                   T. Chol/HDL Ratio                                             Men  Women                               1/2 Avg.Risk  3.4    3.3                                   Avg.Risk  5.0    4.4                                2X Avg.Risk  9.6    7.1                                3X Avg.Risk 23.4   11.0   CMP14+EGFR     Status: Abnormal   Collection Time: 09/13/23 10:00 AM  Result Value Ref Range   Glucose 91 70 - 99 mg/dL   BUN 30 (H) 8 - 27 mg/dL   Creatinine, Ser 8.01 (H) 0.57 - 1.00 mg/dL   eGFR 27 (L) >40 fO/fpw/8.26   BUN/Creatinine Ratio 15 12 - 28   Sodium 139 134 - 144 mmol/L   Potassium 5.2 3.5 - 5.2 mmol/L   Chloride 105 96 - 106 mmol/L   CO2 16 (L) 20 - 29 mmol/L   Calcium  9.4 8.7 - 10.3 mg/dL   Total Protein 7.2 6.0 - 8.5 g/dL   Albumin 3.9 3.9 - 4.9 g/dL   Globulin, Total 3.3 1.5 - 4.5 g/dL   Bilirubin Total 0.4 0.0 - 1.2 mg/dL   Alkaline Phosphatase 130 (H) 44 - 121 IU/L   AST 23 0 - 40 IU/L   ALT 16 0 - 32 IU/L  TSH     Status: None   Collection Time:  09/13/23 10:00 AM  Result Value Ref Range   TSH 2.430 0.450 - 4.500 uIU/mL  Hemoglobin A1c     Status: Abnormal   Collection Time: 09/13/23 10:00 AM  Result Value Ref Range   Hgb A1c MFr Bld 6.2 (H) 4.8 -  5.6 %    Comment:          Prediabetes: 5.7 - 6.4          Diabetes: >6.4          Glycemic control for adults with diabetes: <7.0    Est. average glucose Bld gHb Est-mCnc 131 mg/dL  Rheumatoid Arthritis Profile     Status: None   Collection Time: 09/19/23 10:08 AM  Result Value Ref Range   Rheumatoid fact SerPl-aCnc <10.0 <14.0 IU/mL   Cyclic Citrullin Peptide Ab 12 0 - 19 units    Comment:                           Negative               <20                           Weak positive      20 - 39                           Moderate positive  40 - 59                           Strong positive        >59       Assessment & Plan:  Voltaren gel  Glucosamine chondroitin Schedule physical therapy  Problem List Items Addressed This Visit       Other   Arthralgia of both hands - Primary   Weakness of both lower extremities    Return in about 3 months (around 01/24/2024) for fasing labs prior.   Total time spent: 25 minutes  [employer], NP  10/24/2023   This document may have been prepared by Dragon Voice Recognition software and as such may include unintentional dictation errors.

## 2023-10-28 DIAGNOSIS — I129 Hypertensive chronic kidney disease with stage 1 through stage 4 chronic kidney disease, or unspecified chronic kidney disease: Secondary | ICD-10-CM | POA: Diagnosis not present

## 2023-10-28 DIAGNOSIS — E785 Hyperlipidemia, unspecified: Secondary | ICD-10-CM | POA: Diagnosis not present

## 2023-10-28 DIAGNOSIS — Z720 Tobacco use: Secondary | ICD-10-CM | POA: Diagnosis not present

## 2023-10-28 DIAGNOSIS — N184 Chronic kidney disease, stage 4 (severe): Secondary | ICD-10-CM | POA: Diagnosis not present

## 2023-10-28 DIAGNOSIS — I509 Heart failure, unspecified: Secondary | ICD-10-CM | POA: Diagnosis not present

## 2023-10-29 ENCOUNTER — Other Ambulatory Visit: Payer: Self-pay | Admitting: Cardiology

## 2023-10-29 DIAGNOSIS — R29898 Other symptoms and signs involving the musculoskeletal system: Secondary | ICD-10-CM

## 2023-11-01 DIAGNOSIS — I351 Nonrheumatic aortic (valve) insufficiency: Secondary | ICD-10-CM | POA: Diagnosis not present

## 2023-11-01 DIAGNOSIS — I110 Hypertensive heart disease with heart failure: Secondary | ICD-10-CM | POA: Diagnosis not present

## 2023-11-01 DIAGNOSIS — M19042 Primary osteoarthritis, left hand: Secondary | ICD-10-CM | POA: Diagnosis not present

## 2023-11-01 DIAGNOSIS — K219 Gastro-esophageal reflux disease without esophagitis: Secondary | ICD-10-CM | POA: Diagnosis not present

## 2023-11-01 DIAGNOSIS — M19041 Primary osteoarthritis, right hand: Secondary | ICD-10-CM | POA: Diagnosis not present

## 2023-11-01 DIAGNOSIS — F17210 Nicotine dependence, cigarettes, uncomplicated: Secondary | ICD-10-CM | POA: Diagnosis not present

## 2023-11-01 DIAGNOSIS — I5030 Unspecified diastolic (congestive) heart failure: Secondary | ICD-10-CM | POA: Diagnosis not present

## 2023-11-06 DIAGNOSIS — I5030 Unspecified diastolic (congestive) heart failure: Secondary | ICD-10-CM | POA: Diagnosis not present

## 2023-11-06 DIAGNOSIS — M19041 Primary osteoarthritis, right hand: Secondary | ICD-10-CM | POA: Diagnosis not present

## 2023-11-06 DIAGNOSIS — F17210 Nicotine dependence, cigarettes, uncomplicated: Secondary | ICD-10-CM | POA: Diagnosis not present

## 2023-11-06 DIAGNOSIS — I351 Nonrheumatic aortic (valve) insufficiency: Secondary | ICD-10-CM | POA: Diagnosis not present

## 2023-11-06 DIAGNOSIS — K219 Gastro-esophageal reflux disease without esophagitis: Secondary | ICD-10-CM | POA: Diagnosis not present

## 2023-11-06 DIAGNOSIS — M19042 Primary osteoarthritis, left hand: Secondary | ICD-10-CM | POA: Diagnosis not present

## 2023-11-06 DIAGNOSIS — I110 Hypertensive heart disease with heart failure: Secondary | ICD-10-CM | POA: Diagnosis not present

## 2023-11-07 DIAGNOSIS — M19042 Primary osteoarthritis, left hand: Secondary | ICD-10-CM | POA: Diagnosis not present

## 2023-11-07 DIAGNOSIS — M19041 Primary osteoarthritis, right hand: Secondary | ICD-10-CM | POA: Diagnosis not present

## 2023-11-07 DIAGNOSIS — I110 Hypertensive heart disease with heart failure: Secondary | ICD-10-CM | POA: Diagnosis not present

## 2023-11-07 DIAGNOSIS — I351 Nonrheumatic aortic (valve) insufficiency: Secondary | ICD-10-CM | POA: Diagnosis not present

## 2023-11-07 DIAGNOSIS — K219 Gastro-esophageal reflux disease without esophagitis: Secondary | ICD-10-CM | POA: Diagnosis not present

## 2023-11-07 DIAGNOSIS — F17210 Nicotine dependence, cigarettes, uncomplicated: Secondary | ICD-10-CM | POA: Diagnosis not present

## 2023-11-07 DIAGNOSIS — I5030 Unspecified diastolic (congestive) heart failure: Secondary | ICD-10-CM | POA: Diagnosis not present

## 2023-11-08 ENCOUNTER — Other Ambulatory Visit: Payer: Self-pay | Admitting: Cardiovascular Disease

## 2023-11-08 DIAGNOSIS — I5032 Chronic diastolic (congestive) heart failure: Secondary | ICD-10-CM

## 2023-11-08 DIAGNOSIS — I5033 Acute on chronic diastolic (congestive) heart failure: Secondary | ICD-10-CM

## 2023-11-11 DIAGNOSIS — M19041 Primary osteoarthritis, right hand: Secondary | ICD-10-CM | POA: Diagnosis not present

## 2023-11-11 DIAGNOSIS — F17210 Nicotine dependence, cigarettes, uncomplicated: Secondary | ICD-10-CM | POA: Diagnosis not present

## 2023-11-11 DIAGNOSIS — M19042 Primary osteoarthritis, left hand: Secondary | ICD-10-CM | POA: Diagnosis not present

## 2023-11-11 DIAGNOSIS — I110 Hypertensive heart disease with heart failure: Secondary | ICD-10-CM | POA: Diagnosis not present

## 2023-11-11 DIAGNOSIS — I351 Nonrheumatic aortic (valve) insufficiency: Secondary | ICD-10-CM | POA: Diagnosis not present

## 2023-11-11 DIAGNOSIS — I5030 Unspecified diastolic (congestive) heart failure: Secondary | ICD-10-CM | POA: Diagnosis not present

## 2023-11-11 DIAGNOSIS — K219 Gastro-esophageal reflux disease without esophagitis: Secondary | ICD-10-CM | POA: Diagnosis not present

## 2023-11-15 ENCOUNTER — Telehealth: Payer: Self-pay

## 2023-11-15 DIAGNOSIS — F17210 Nicotine dependence, cigarettes, uncomplicated: Secondary | ICD-10-CM | POA: Diagnosis not present

## 2023-11-15 DIAGNOSIS — M19042 Primary osteoarthritis, left hand: Secondary | ICD-10-CM | POA: Diagnosis not present

## 2023-11-15 DIAGNOSIS — K219 Gastro-esophageal reflux disease without esophagitis: Secondary | ICD-10-CM | POA: Diagnosis not present

## 2023-11-15 DIAGNOSIS — I5030 Unspecified diastolic (congestive) heart failure: Secondary | ICD-10-CM | POA: Diagnosis not present

## 2023-11-15 DIAGNOSIS — I110 Hypertensive heart disease with heart failure: Secondary | ICD-10-CM | POA: Diagnosis not present

## 2023-11-15 DIAGNOSIS — I351 Nonrheumatic aortic (valve) insufficiency: Secondary | ICD-10-CM | POA: Diagnosis not present

## 2023-11-15 DIAGNOSIS — M19041 Primary osteoarthritis, right hand: Secondary | ICD-10-CM | POA: Diagnosis not present

## 2023-11-15 NOTE — Telephone Encounter (Signed)
 OT with [facility] called asking for verbal okay for 1W x 4W and 1Wx 2W please advise [phone]

## 2023-11-18 NOTE — Telephone Encounter (Signed)
 OT with [facility] informed

## 2023-11-20 DIAGNOSIS — I351 Nonrheumatic aortic (valve) insufficiency: Secondary | ICD-10-CM | POA: Diagnosis not present

## 2023-11-20 DIAGNOSIS — M19041 Primary osteoarthritis, right hand: Secondary | ICD-10-CM | POA: Diagnosis not present

## 2023-11-20 DIAGNOSIS — M19042 Primary osteoarthritis, left hand: Secondary | ICD-10-CM | POA: Diagnosis not present

## 2023-11-20 DIAGNOSIS — I110 Hypertensive heart disease with heart failure: Secondary | ICD-10-CM | POA: Diagnosis not present

## 2023-11-20 DIAGNOSIS — I5030 Unspecified diastolic (congestive) heart failure: Secondary | ICD-10-CM | POA: Diagnosis not present

## 2023-11-20 DIAGNOSIS — K219 Gastro-esophageal reflux disease without esophagitis: Secondary | ICD-10-CM | POA: Diagnosis not present

## 2023-11-20 DIAGNOSIS — F17210 Nicotine dependence, cigarettes, uncomplicated: Secondary | ICD-10-CM | POA: Diagnosis not present

## 2023-11-21 DIAGNOSIS — I351 Nonrheumatic aortic (valve) insufficiency: Secondary | ICD-10-CM | POA: Diagnosis not present

## 2023-11-21 DIAGNOSIS — F17210 Nicotine dependence, cigarettes, uncomplicated: Secondary | ICD-10-CM | POA: Diagnosis not present

## 2023-11-21 DIAGNOSIS — M19041 Primary osteoarthritis, right hand: Secondary | ICD-10-CM | POA: Diagnosis not present

## 2023-11-21 DIAGNOSIS — I110 Hypertensive heart disease with heart failure: Secondary | ICD-10-CM | POA: Diagnosis not present

## 2023-11-21 DIAGNOSIS — I5030 Unspecified diastolic (congestive) heart failure: Secondary | ICD-10-CM | POA: Diagnosis not present

## 2023-11-21 DIAGNOSIS — K219 Gastro-esophageal reflux disease without esophagitis: Secondary | ICD-10-CM | POA: Diagnosis not present

## 2023-11-21 DIAGNOSIS — N184 Chronic kidney disease, stage 4 (severe): Secondary | ICD-10-CM | POA: Diagnosis not present

## 2023-11-21 DIAGNOSIS — M19042 Primary osteoarthritis, left hand: Secondary | ICD-10-CM | POA: Diagnosis not present

## 2023-11-26 DIAGNOSIS — I509 Heart failure, unspecified: Secondary | ICD-10-CM | POA: Diagnosis not present

## 2023-11-26 DIAGNOSIS — E785 Hyperlipidemia, unspecified: Secondary | ICD-10-CM | POA: Diagnosis not present

## 2023-11-26 DIAGNOSIS — N184 Chronic kidney disease, stage 4 (severe): Secondary | ICD-10-CM | POA: Diagnosis not present

## 2023-11-26 DIAGNOSIS — Z720 Tobacco use: Secondary | ICD-10-CM | POA: Diagnosis not present

## 2023-11-26 DIAGNOSIS — I129 Hypertensive chronic kidney disease with stage 1 through stage 4 chronic kidney disease, or unspecified chronic kidney disease: Secondary | ICD-10-CM | POA: Diagnosis not present

## 2023-11-27 DIAGNOSIS — I509 Heart failure, unspecified: Secondary | ICD-10-CM | POA: Diagnosis not present

## 2023-11-27 DIAGNOSIS — E785 Hyperlipidemia, unspecified: Secondary | ICD-10-CM | POA: Diagnosis not present

## 2023-11-27 DIAGNOSIS — Z720 Tobacco use: Secondary | ICD-10-CM | POA: Diagnosis not present

## 2023-11-27 DIAGNOSIS — M19041 Primary osteoarthritis, right hand: Secondary | ICD-10-CM | POA: Diagnosis not present

## 2023-11-27 DIAGNOSIS — F17210 Nicotine dependence, cigarettes, uncomplicated: Secondary | ICD-10-CM | POA: Diagnosis not present

## 2023-11-27 DIAGNOSIS — I110 Hypertensive heart disease with heart failure: Secondary | ICD-10-CM | POA: Diagnosis not present

## 2023-11-27 DIAGNOSIS — N2581 Secondary hyperparathyroidism of renal origin: Secondary | ICD-10-CM | POA: Diagnosis not present

## 2023-11-27 DIAGNOSIS — I129 Hypertensive chronic kidney disease with stage 1 through stage 4 chronic kidney disease, or unspecified chronic kidney disease: Secondary | ICD-10-CM | POA: Diagnosis not present

## 2023-11-27 DIAGNOSIS — N184 Chronic kidney disease, stage 4 (severe): Secondary | ICD-10-CM | POA: Diagnosis not present

## 2023-11-27 DIAGNOSIS — I5030 Unspecified diastolic (congestive) heart failure: Secondary | ICD-10-CM | POA: Diagnosis not present

## 2023-11-27 DIAGNOSIS — M19042 Primary osteoarthritis, left hand: Secondary | ICD-10-CM | POA: Diagnosis not present

## 2023-11-27 DIAGNOSIS — K219 Gastro-esophageal reflux disease without esophagitis: Secondary | ICD-10-CM | POA: Diagnosis not present

## 2023-11-27 DIAGNOSIS — I351 Nonrheumatic aortic (valve) insufficiency: Secondary | ICD-10-CM | POA: Diagnosis not present

## 2023-11-29 DIAGNOSIS — M19042 Primary osteoarthritis, left hand: Secondary | ICD-10-CM | POA: Diagnosis not present

## 2023-11-29 DIAGNOSIS — F17210 Nicotine dependence, cigarettes, uncomplicated: Secondary | ICD-10-CM | POA: Diagnosis not present

## 2023-11-29 DIAGNOSIS — I110 Hypertensive heart disease with heart failure: Secondary | ICD-10-CM | POA: Diagnosis not present

## 2023-11-29 DIAGNOSIS — I351 Nonrheumatic aortic (valve) insufficiency: Secondary | ICD-10-CM | POA: Diagnosis not present

## 2023-11-29 DIAGNOSIS — I5030 Unspecified diastolic (congestive) heart failure: Secondary | ICD-10-CM | POA: Diagnosis not present

## 2023-11-29 DIAGNOSIS — M19041 Primary osteoarthritis, right hand: Secondary | ICD-10-CM | POA: Diagnosis not present

## 2023-11-29 DIAGNOSIS — K219 Gastro-esophageal reflux disease without esophagitis: Secondary | ICD-10-CM | POA: Diagnosis not present

## 2023-12-01 ENCOUNTER — Other Ambulatory Visit: Payer: Self-pay | Admitting: Cardiology

## 2023-12-05 ENCOUNTER — Telehealth: Payer: Self-pay

## 2023-12-05 DIAGNOSIS — I351 Nonrheumatic aortic (valve) insufficiency: Secondary | ICD-10-CM | POA: Diagnosis not present

## 2023-12-05 DIAGNOSIS — I5030 Unspecified diastolic (congestive) heart failure: Secondary | ICD-10-CM | POA: Diagnosis not present

## 2023-12-05 DIAGNOSIS — K219 Gastro-esophageal reflux disease without esophagitis: Secondary | ICD-10-CM | POA: Diagnosis not present

## 2023-12-05 DIAGNOSIS — M19041 Primary osteoarthritis, right hand: Secondary | ICD-10-CM | POA: Diagnosis not present

## 2023-12-05 DIAGNOSIS — M19042 Primary osteoarthritis, left hand: Secondary | ICD-10-CM | POA: Diagnosis not present

## 2023-12-05 DIAGNOSIS — F17210 Nicotine dependence, cigarettes, uncomplicated: Secondary | ICD-10-CM | POA: Diagnosis not present

## 2023-12-05 DIAGNOSIS — I110 Hypertensive heart disease with heart failure: Secondary | ICD-10-CM | POA: Diagnosis not present

## 2023-12-05 NOTE — Telephone Encounter (Signed)
 Vertell with [facility] called asking for another diagnosis code to be sent with referral states the one that was sent will not be covered by medicare

## 2023-12-10 ENCOUNTER — Other Ambulatory Visit: Payer: Self-pay | Admitting: Cardiology

## 2023-12-10 DIAGNOSIS — N184 Chronic kidney disease, stage 4 (severe): Secondary | ICD-10-CM

## 2023-12-10 DIAGNOSIS — R29898 Other symptoms and signs involving the musculoskeletal system: Secondary | ICD-10-CM

## 2023-12-10 DIAGNOSIS — I5032 Chronic diastolic (congestive) heart failure: Secondary | ICD-10-CM

## 2023-12-10 DIAGNOSIS — M25541 Pain in joints of right hand: Secondary | ICD-10-CM

## 2023-12-10 DIAGNOSIS — I5033 Acute on chronic diastolic (congestive) heart failure: Secondary | ICD-10-CM

## 2023-12-10 DIAGNOSIS — R42 Dizziness and giddiness: Secondary | ICD-10-CM

## 2023-12-11 ENCOUNTER — Telehealth: Payer: Self-pay | Admitting: Cardiology

## 2023-12-11 DIAGNOSIS — M19041 Primary osteoarthritis, right hand: Secondary | ICD-10-CM | POA: Diagnosis not present

## 2023-12-11 DIAGNOSIS — I351 Nonrheumatic aortic (valve) insufficiency: Secondary | ICD-10-CM | POA: Diagnosis not present

## 2023-12-11 DIAGNOSIS — F17210 Nicotine dependence, cigarettes, uncomplicated: Secondary | ICD-10-CM | POA: Diagnosis not present

## 2023-12-11 DIAGNOSIS — I5030 Unspecified diastolic (congestive) heart failure: Secondary | ICD-10-CM | POA: Diagnosis not present

## 2023-12-11 DIAGNOSIS — M19042 Primary osteoarthritis, left hand: Secondary | ICD-10-CM | POA: Diagnosis not present

## 2023-12-11 DIAGNOSIS — I110 Hypertensive heart disease with heart failure: Secondary | ICD-10-CM | POA: Diagnosis not present

## 2023-12-11 DIAGNOSIS — K219 Gastro-esophageal reflux disease without esophagitis: Secondary | ICD-10-CM | POA: Diagnosis not present

## 2023-12-11 NOTE — Telephone Encounter (Signed)
 Amy from [facility] called needing the diagnosis for patient's hand weakness that they are seeing her for PT. Please advise.  Amy [phone]

## 2023-12-14 ENCOUNTER — Other Ambulatory Visit: Payer: Self-pay | Admitting: Cardiology

## 2023-12-16 DIAGNOSIS — F17210 Nicotine dependence, cigarettes, uncomplicated: Secondary | ICD-10-CM | POA: Diagnosis not present

## 2023-12-16 DIAGNOSIS — I5030 Unspecified diastolic (congestive) heart failure: Secondary | ICD-10-CM | POA: Diagnosis not present

## 2023-12-16 DIAGNOSIS — M19041 Primary osteoarthritis, right hand: Secondary | ICD-10-CM | POA: Diagnosis not present

## 2023-12-16 DIAGNOSIS — I351 Nonrheumatic aortic (valve) insufficiency: Secondary | ICD-10-CM | POA: Diagnosis not present

## 2023-12-16 DIAGNOSIS — I110 Hypertensive heart disease with heart failure: Secondary | ICD-10-CM | POA: Diagnosis not present

## 2023-12-16 DIAGNOSIS — K219 Gastro-esophageal reflux disease without esophagitis: Secondary | ICD-10-CM | POA: Diagnosis not present

## 2023-12-16 DIAGNOSIS — M19042 Primary osteoarthritis, left hand: Secondary | ICD-10-CM | POA: Diagnosis not present

## 2023-12-24 ENCOUNTER — Telehealth: Payer: Self-pay

## 2023-12-24 DIAGNOSIS — I5030 Unspecified diastolic (congestive) heart failure: Secondary | ICD-10-CM | POA: Diagnosis not present

## 2023-12-24 DIAGNOSIS — M19042 Primary osteoarthritis, left hand: Secondary | ICD-10-CM | POA: Diagnosis not present

## 2023-12-24 DIAGNOSIS — K219 Gastro-esophageal reflux disease without esophagitis: Secondary | ICD-10-CM | POA: Diagnosis not present

## 2023-12-24 DIAGNOSIS — F17210 Nicotine dependence, cigarettes, uncomplicated: Secondary | ICD-10-CM | POA: Diagnosis not present

## 2023-12-24 DIAGNOSIS — I351 Nonrheumatic aortic (valve) insufficiency: Secondary | ICD-10-CM | POA: Diagnosis not present

## 2023-12-24 DIAGNOSIS — I110 Hypertensive heart disease with heart failure: Secondary | ICD-10-CM | POA: Diagnosis not present

## 2023-12-24 DIAGNOSIS — M19041 Primary osteoarthritis, right hand: Secondary | ICD-10-CM | POA: Diagnosis not present

## 2023-12-24 NOTE — Telephone Encounter (Signed)
 Disharia with [facility] called to let you know that the patient is having some Bilateral Hand Edema, Strength varying decreased range of motion and numbnes and tingling , no pain

## 2023-12-25 DIAGNOSIS — I5030 Unspecified diastolic (congestive) heart failure: Secondary | ICD-10-CM | POA: Diagnosis not present

## 2023-12-25 DIAGNOSIS — F17210 Nicotine dependence, cigarettes, uncomplicated: Secondary | ICD-10-CM | POA: Diagnosis not present

## 2023-12-25 DIAGNOSIS — I110 Hypertensive heart disease with heart failure: Secondary | ICD-10-CM | POA: Diagnosis not present

## 2023-12-25 DIAGNOSIS — K219 Gastro-esophageal reflux disease without esophagitis: Secondary | ICD-10-CM | POA: Diagnosis not present

## 2023-12-25 DIAGNOSIS — I351 Nonrheumatic aortic (valve) insufficiency: Secondary | ICD-10-CM | POA: Diagnosis not present

## 2023-12-25 DIAGNOSIS — M19041 Primary osteoarthritis, right hand: Secondary | ICD-10-CM | POA: Diagnosis not present

## 2023-12-25 DIAGNOSIS — M19042 Primary osteoarthritis, left hand: Secondary | ICD-10-CM | POA: Diagnosis not present

## 2024-01-02 ENCOUNTER — Other Ambulatory Visit: Payer: Self-pay | Admitting: Cardiovascular Disease

## 2024-01-02 DIAGNOSIS — I351 Nonrheumatic aortic (valve) insufficiency: Secondary | ICD-10-CM

## 2024-01-02 DIAGNOSIS — I5033 Acute on chronic diastolic (congestive) heart failure: Secondary | ICD-10-CM

## 2024-01-02 DIAGNOSIS — R0602 Shortness of breath: Secondary | ICD-10-CM

## 2024-01-02 DIAGNOSIS — I340 Nonrheumatic mitral (valve) insufficiency: Secondary | ICD-10-CM

## 2024-01-02 DIAGNOSIS — I5032 Chronic diastolic (congestive) heart failure: Secondary | ICD-10-CM

## 2024-01-02 DIAGNOSIS — Z720 Tobacco use: Secondary | ICD-10-CM

## 2024-01-02 DIAGNOSIS — R0789 Other chest pain: Secondary | ICD-10-CM

## 2024-01-06 DIAGNOSIS — N2581 Secondary hyperparathyroidism of renal origin: Secondary | ICD-10-CM | POA: Diagnosis not present

## 2024-01-06 DIAGNOSIS — I129 Hypertensive chronic kidney disease with stage 1 through stage 4 chronic kidney disease, or unspecified chronic kidney disease: Secondary | ICD-10-CM | POA: Diagnosis not present

## 2024-01-06 DIAGNOSIS — N184 Chronic kidney disease, stage 4 (severe): Secondary | ICD-10-CM | POA: Diagnosis not present

## 2024-01-06 DIAGNOSIS — E785 Hyperlipidemia, unspecified: Secondary | ICD-10-CM | POA: Diagnosis not present

## 2024-01-06 DIAGNOSIS — I509 Heart failure, unspecified: Secondary | ICD-10-CM | POA: Diagnosis not present

## 2024-01-09 DIAGNOSIS — I509 Heart failure, unspecified: Secondary | ICD-10-CM | POA: Diagnosis not present

## 2024-01-09 DIAGNOSIS — N2581 Secondary hyperparathyroidism of renal origin: Secondary | ICD-10-CM | POA: Diagnosis not present

## 2024-01-09 DIAGNOSIS — E785 Hyperlipidemia, unspecified: Secondary | ICD-10-CM | POA: Diagnosis not present

## 2024-01-09 DIAGNOSIS — N184 Chronic kidney disease, stage 4 (severe): Secondary | ICD-10-CM | POA: Diagnosis not present

## 2024-01-09 DIAGNOSIS — I129 Hypertensive chronic kidney disease with stage 1 through stage 4 chronic kidney disease, or unspecified chronic kidney disease: Secondary | ICD-10-CM | POA: Diagnosis not present

## 2024-01-09 DIAGNOSIS — Z720 Tobacco use: Secondary | ICD-10-CM | POA: Diagnosis not present

## 2024-01-09 NOTE — Progress Notes (Signed)
 [facility] Follow Up Visit   Patient Name: Sarah Mack, female   Patient DOB: June 09, 1955 Date of Service: 01/09/2024  Patient MRN: [id number] Provider Creating Note: Woodward Brought, MD  [phone] Primary Care Physician: Fernand Fredy RAMAN, MD   [address] [place] [address] Additional Physicians/ Providers:   Impression/Recommendations   Ms. Sarah Mack is a 68 y.o. female  with hypertension, congestive heart failure, aortic valve insufficiency and hyperlipidemia who presents as a follow up patient for evaluation of chronic kidney disease stage IV, creatinine 1.96, GFR of 29.  KFRE: 6.21% in 2 years and 18.87% in 5 years.    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  - discontinued celebrex . Discontinue use of all nonsteroidal anti-inflammatory agents.  - Discontinued ramipril  due to hypotension - continue dapagliflozin  - continue spironolactone  - off semaglutide    Hypertension with chronic kidney disease and chronic congestive heart failure: 124/71. With no orthostatic symptoms - discontinued ramipril   - Continue spironolactone , torsemide  and metoprolol .  - recommend home blood pressure monitoring - discussed low salt diet  Secondary Hyperparathyroidism: PTH 60. - continue cholecalciferol.    Hyperlipidemia - continue rosuvastatin    Tobacco use.  - discussed smoking cessation with patient  Patient Active Problem List  Diagnosis  . Chronic kidney disease, Stage IV (severe) (HCC)  . Hypertensive chronic kidney disease, benign, with chronic kidney disease stage I through stage IV, or unspecified  . Hyperlipidemia  . Congestive heart failure (HCC)  . Tobacco use  . Secondary hyperparathyroidism of renal origin [facility])    Orders Placed This Encounter  . PTH, Intact  . Renal Function Panel  . Urinalysis, Complete w/reflex to Culture  . Protein, Total,  Random Urine w/Creatinine (Protein/Creat Ratio)  . terbinafine (LamISIL AT) 1 % cream       Return in about 3 months (around 04/10/2024).  Chief Complaint   Chief Complaint  Patient presents with  . Follow-up    History of Present Illness   Ms. Sarah Mack presents for follow up. Patient presents with her daughter who assists with history taking.   Patient has reduced to 1/2 pack per day smoking.   Patient has stopped taking ramipril  on her own on last visit due to hypotension. She was then asked to stop torsemide . However she developed lower extremity swelling. She is now taking torsemide  every other day.   Patient complains of a rash on her back. She has been using topical triamcinalone.   Denies any more lightheadedness or dizziness. Denies any falls. She uses a rolling walker to assist with ambulation.   Denies use of nonsteroidal anti-inflammatory agents.   No hospitalizations.     Medications   Current Outpatient Medications:  .  torsemide  (DEMADEX ) 20 MG tablet, Take 20 mg by mouth every other day, Disp: , Rfl:  .  acetaminophen  (TYLENOL ) 500 MG tablet, Take by mouth if needed for mild pain, Disp: , Rfl:  .  albuterol  HFA (PROVENTIL  HFA;VENTOLIN  HFA) 108 (90 Base) MCG/ACT inhaler, Inhale 1 puff into the lungs as needed for wheezing or shortness of breath., Disp: , Rfl:  .  Aspirin  ([place] ) 81 MG capsule, Take 1 tablet by mouth 1 (one) time each day, Disp: , Rfl:  .  Cetirizine HCl 10 MG capsule, Take 10 mg by mouth in the morning., Disp: , Rfl:  .  Cholecalciferol (Vitamin D3) 25 MCG (1000 UT) capsule, Take 1 capsule by mouth 1 (one)  time each day, Disp: , Rfl:  .  Dapagliflozin  Propanediol (Farxiga ) 10 MG tablet, Take 10 mg by mouth 1 (one) time each day before breakfast, Disp: , Rfl:  .  metoprolol  succinate XL (TOPROL  XL) 25 MG 24 hr tablet, Take 25 mg by mouth 1 (one) time each day, Disp: , Rfl:  .  montelukast (SINGULAIR) 10 MG tablet, Take 10 mg by mouth 1  (one) time each day in the evening, Disp: , Rfl:  .  rosuvastatin  (CRESTOR ) 20 MG tablet, Take 20 mg by mouth 1 (one) time each day, Disp: , Rfl:  .  spironolactone  (ALDACTONE ) 25 MG tablet, Take 25 mg by mouth 1 (one) time each day, Disp: , Rfl:  .  terbinafine (LamISIL AT) 1 % cream, Apply topically in the morning and in the evening., Disp: 30 g, Rfl: 1 .  triamcinolone  (KENALOG ) 0.5 % ointment, Apply 1 Application topically 2 (two) times daily., Disp: , Rfl:    Allergies Patient has no known allergies.  History Past Medical History:  Diagnosis Date  . Aortic valve stenosis with insufficiency   . Chronic kidney disease stage 3B (HCC)   . Congestive heart failure (HCC)   . Hyperlipidemia   . 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 Secondary hyperparathyroidism of renal origin (HCC) 10/28/2023  . Tobacco use     History reviewed. No pertinent surgical history. Family History  Problem Relation Age of Onset  . Gout Mother   . Hypertension Mother   . Cancer Mother   . Hypertension Father   . Diabetes Father    Social History   Tobacco Use  . Smoking status: Every Day    Current packs/day: 0.50    Types: Cigarettes  . Smokeless tobacco: Never  Substance Use Topics  . Alcohol use: Never     Physical Exam  Vitals BP 123/72 (BP Location: Right forearm, Patient Position: Standing)   Pulse 85   Temp 97.8 F   Wt 213 lb 6.4 oz (96.8 kg)   SpO2 99%   BMI 43.10 kg/m   Vitals reviewed. Constitutional: She is oriented to person, place, and time. She appears well-developed.  HEENT:  Head: Normocephalic and atraumatic. Mouth/Throat: Oropharynx is clear and moist.  Eyes: Pupils are equal, round, and reactive to light.  Neck: Neck supple.  Cardiovascular:  Normal rate and regular rhythm.           Pulmonary/Chest: Effort normal and breath sounds normal.  Abdominal: Soft.  Neurological: She is alert  and oriented to person, place, and time.  Skin: Skin is warm and dry.     Laboratory Studies  Chemistry  Lab Units 01/06/24 0856 11/26/23 0835 09/26/23 1016 09/26/23 0936  SODIUM mmol/L 138 139 137  --   POTASSIUM mmol/L 4.3 4.4 4.8  --   CHLORIDE mmol/L 107 106 106  --   CO2 mmol/L 23 24 19*  --   MAGNESIUM mg/dL  --   --  2.4  --   CALCIUM  mg/dL 9.7 9.6 9.1  --   PHOSPHORUS mg/dL 4.3 4.1 4.4*  --   PTH pg/mL 60  --  59  --   GLUCOSE mg/dL 898* 99 99  --   ALBUMIN g/dL 4.2 4.1 3.8  3.9 80 mg/L  BUN mg/dL 28* 34* 42*  --   CREATININE mg/dL 8.09* 8.03* 7.84*  --         No  lab exists for component: IRON SATURATION, TRANSSATPER  CBC  Lab Units 01/06/24 0856 11/26/23 0835 09/26/23 1016  WBC AUTO Thousand/uL 6.2  --  7.1  HEMOGLOBIN g/dL 86.1  --  87.1  HEMOGLOBIN URINE  NEGATIVE NEGATIVE  --   HEMATOCRIT % 42.7  --  40.9  MCV fL 97.3  --  96.0  PLATELETS AUTO Thousand/uL 231  --  200    Urine  Lab Units 01/06/24 0856 11/26/23 0835 09/26/23 0936  COLOR U  YELLOW YELLOW  --   COLOR UA   --   --  Yellow  CLARITY UA   --   --  Clear  KETONES U MG/DL  NEGATIVE NEGATIVE  --   KETONES UA   --   --  Negative  PH UA   --   --  5.5  UROBILINOGEN UA   --   --  0.2  PROT/CREAT RATIO UR mg/g creat 0.349*  349* 0.295*  295*  --         No lab exists for component: CYCLOSPORITR     Woodward Brought, MD  [employer], [place]

## 2024-01-16 ENCOUNTER — Ambulatory Visit (INDEPENDENT_AMBULATORY_CARE_PROVIDER_SITE_OTHER): Admitting: Cardiovascular Disease

## 2024-01-16 ENCOUNTER — Encounter: Payer: Self-pay | Admitting: Cardiovascular Disease

## 2024-01-16 VITALS — BP 112/68 | HR 81 | Ht 59.0 in | Wt 210.6 lb

## 2024-01-16 DIAGNOSIS — N184 Chronic kidney disease, stage 4 (severe): Secondary | ICD-10-CM | POA: Diagnosis not present

## 2024-01-16 DIAGNOSIS — I351 Nonrheumatic aortic (valve) insufficiency: Secondary | ICD-10-CM | POA: Diagnosis not present

## 2024-01-16 DIAGNOSIS — R0602 Shortness of breath: Secondary | ICD-10-CM

## 2024-01-16 DIAGNOSIS — Z0130 Encounter for examination of blood pressure without abnormal findings: Secondary | ICD-10-CM

## 2024-01-16 DIAGNOSIS — F17210 Nicotine dependence, cigarettes, uncomplicated: Secondary | ICD-10-CM | POA: Diagnosis not present

## 2024-01-16 DIAGNOSIS — I340 Nonrheumatic mitral (valve) insufficiency: Secondary | ICD-10-CM | POA: Diagnosis not present

## 2024-01-16 DIAGNOSIS — I5032 Chronic diastolic (congestive) heart failure: Secondary | ICD-10-CM | POA: Diagnosis not present

## 2024-01-16 DIAGNOSIS — Z720 Tobacco use: Secondary | ICD-10-CM

## 2024-01-16 DIAGNOSIS — Z131 Encounter for screening for diabetes mellitus: Secondary | ICD-10-CM

## 2024-01-16 NOTE — Progress Notes (Signed)
 Cardiology Office Note   Date:  01/16/2024   ID:  Suzan, Manon 1956-02-05, MRN [id number]  PCP:  Carin Gauze, NP  Cardiologist:  Denyse Bathe, MD      History of Present Illness: TANEYA CONKEL is a 68 y.o. female who presents for  Chief Complaint  Patient presents with   Follow-up    3 month follow up    Has arthritis in hands.      Past Medical History:  Diagnosis Date   (HFpEF) heart failure with preserved ejection fraction (HCC)    a. 10/2015 Echo: EF 55-60%, no rwma, mild to mod AI, nl RV fxn.   Aortic insufficiency    a. 10/2015 Echo: Mild to mod AI.   GERD (gastroesophageal reflux disease)    History of stress test    a. 11/2016 Echo: Small region of mild fixed apical thinning. No ischemia/infarct. Low risk.   Hypertension    Leaky heart valve      Past Surgical History:  Procedure Laterality Date   COLONOSCOPY WITH PROPOFOL  N/A 08/06/2017   Procedure: COLONOSCOPY WITH PROPOFOL ;  Surgeon: Jinny Carmine, MD;  Location: [facility] ENDOSCOPY;  Service: Endoscopy;  Laterality: N/A;   COLONOSCOPY WITH PROPOFOL  N/A 03/09/2019   Procedure: COLONOSCOPY WITH PROPOFOL ;  Surgeon: Toledo, Ladell POUR, MD;  Location: [facility] ENDOSCOPY;  Service: Gastroenterology;  Laterality: N/A;   NO PAST SURGERIES       Current Outpatient Medications  Medication Sig Dispense Refill   albuterol  (VENTOLIN  HFA) 108 (90 Base) MCG/ACT inhaler Inhale 1 puff into the lungs as needed for wheezing or shortness of breath.     Cholecalciferol (VITAMIN D3) 25 MCG (1000 UT) CAPS Take 1 capsule by mouth daily.     FARXIGA  10 MG TABS tablet TAKE 1 TABLET BY MOUTH DAILY BEFORE BREAKFAST. 30 tablet 3   metoprolol  succinate (TOPROL  XL) 25 MG 24 hr tablet Take 1 tablet (25 mg total) by mouth daily. 30 tablet 11   rosuvastatin  (CRESTOR ) 20 MG tablet TAKE 1 TABLET BY MOUTH EVERY DAY 90 tablet 3   spironolactone  (ALDACTONE ) 25 MG tablet TAKE 1 TABLET (25 MG TOTAL) BY MOUTH DAILY. 90 tablet 0    torsemide  (DEMADEX ) 20 MG tablet TAKE 1 TABLET BY MOUTH EVERY DAY 90 tablet 1   triamcinolone  ointment (KENALOG ) 0.5 % Apply 1 Application topically 2 (two) times daily. 30 g 0   ramipril  (ALTACE ) 10 MG capsule TAKE 1 CAPSULE BY MOUTH EVERY DAY IN THE MORNING (Patient not taking: Reported on 01/16/2024) 90 capsule 2   No current facility-administered medications for this visit.    Allergies:   Patient has no known allergies.    Social History:   reports that she has been smoking cigarettes. She has a 5 pack-year smoking history. She has never used smokeless tobacco. She reports that she does not drink alcohol and does not use drugs.   Family History:  family history includes Alcoholism in her brother; Diabetes in her father; Heart failure in her father; Hypertension in her brother, brother, brother, and sister; Leukemia in her sister; Liver cancer in her mother.    ROS:     Review of Systems  Constitutional: Negative.   HENT: Negative.    Eyes: Negative.   Respiratory: Negative.    Gastrointestinal: Negative.   Genitourinary: Negative.   Musculoskeletal: Negative.   Skin: Negative.   Neurological: Negative.   Endo/Heme/Allergies: Negative.   Psychiatric/Behavioral: Negative.    All other systems reviewed  and are negative.     All other systems are reviewed and negative.    PHYSICAL EXAM: VS:  BP 112/68   Pulse 81   Ht 4' 11 (1.499 m)   Wt 210 lb 9.6 oz (95.5 kg)   LMP  (LMP Unknown)   SpO2 97%   BMI 42.54 kg/m  , BMI Body mass index is 42.54 kg/m. Last weight:  Wt Readings from Last 3 Encounters:  01/16/24 210 lb 9.6 oz (95.5 kg)  10/24/23 213 lb 12.8 oz (97 kg)  10/17/23 212 lb (96.2 kg)     Physical Exam Constitutional:      Appearance: Normal appearance.  Cardiovascular:     Rate and Rhythm: Normal rate and regular rhythm.     Heart sounds: Normal heart sounds.  Pulmonary:     Effort: Pulmonary effort is normal.     Breath sounds: Normal breath sounds.   Musculoskeletal:     Right lower leg: No edema.     Left lower leg: No edema.  Neurological:     Mental Status: She is alert.       EKG:   Recent Labs: 09/13/2023: ALT 16; BUN 30; Creatinine, Ser 1.98; Potassium 5.2; Sodium 139; TSH 2.430    Lipid Panel    Component Value Date/Time   CHOL 126 09/13/2023 1000   TRIG 62 09/13/2023 1000   HDL 47 09/13/2023 1000   CHOLHDL 2.7 09/13/2023 1000   LDLCALC 66 09/13/2023 1000      Other studies Reviewed: Additional studies/ records that were reviewed today include:  Review of the above records demonstrates:      11/29/2016    9:38 AM 05/16/2016    1:27 PM  PAD Screen  Previous PAD dx? No No  Previous surgical procedure? No No  Pain with walking? No No  Feet/toe relief with dangling? Yes Yes  Painful, non-healing ulcers? No No  Extremities discolored? No No      ASSESSMENT AND PLAN:    ICD-10-CM   1. Chronic diastolic heart failure (HCC)  P49.67     2. SOB (shortness of breath)  R06.02     3. Tobacco use  Z72.0     4. Nonrheumatic mitral valve regurgitation  I34.0     5. Nonrheumatic aortic valve insufficiency  I35.1     6. Stage 4 chronic kidney disease (HCC)  N18.4    Dr. Consuela stopped altace  10 mg. As has hypotension.       Problem List Items Addressed This Visit       Cardiovascular and Mediastinum   Chronic diastolic heart failure (HCC) - Primary (Chronic)   Aortic insufficiency (Chronic)   Nonrheumatic mitral valve regurgitation     Genitourinary   Stage 4 chronic kidney disease (HCC)     Other   Tobacco use (Chronic)   Other Visit Diagnoses       SOB (shortness of breath)              Disposition:   No follow-ups on file.    Total time spent: 35 minutes  Signed,  Denyse Bathe, MD  01/16/2024 10:28 AM    [facility]

## 2024-01-28 ENCOUNTER — Other Ambulatory Visit

## 2024-01-28 DIAGNOSIS — E669 Obesity, unspecified: Secondary | ICD-10-CM

## 2024-01-28 DIAGNOSIS — I10 Essential (primary) hypertension: Secondary | ICD-10-CM

## 2024-01-28 DIAGNOSIS — E782 Mixed hyperlipidemia: Secondary | ICD-10-CM

## 2024-01-28 DIAGNOSIS — R7303 Prediabetes: Secondary | ICD-10-CM

## 2024-01-29 ENCOUNTER — Ambulatory Visit: Payer: Self-pay | Admitting: Cardiology

## 2024-01-29 LAB — CMP14+EGFR
ALT: 5 IU/L (ref 0–32)
AST: 10 IU/L (ref 0–40)
Albumin: 4 g/dL (ref 3.9–4.9)
Alkaline Phosphatase: 113 IU/L (ref 49–135)
BUN/Creatinine Ratio: 15 (ref 12–28)
BUN: 27 mg/dL (ref 8–27)
Bilirubin Total: 0.3 mg/dL (ref 0.0–1.2)
CO2: 22 mmol/L (ref 20–29)
Calcium: 9.8 mg/dL (ref 8.7–10.3)
Chloride: 103 mmol/L (ref 96–106)
Creatinine, Ser: 1.85 mg/dL — ABNORMAL HIGH (ref 0.57–1.00)
Globulin, Total: 3.5 g/dL (ref 1.5–4.5)
Glucose: 95 mg/dL (ref 70–99)
Potassium: 4.9 mmol/L (ref 3.5–5.2)
Sodium: 139 mmol/L (ref 134–144)
Total Protein: 7.5 g/dL (ref 6.0–8.5)
eGFR: 30 mL/min/1.73 — ABNORMAL LOW (ref >59)

## 2024-01-29 LAB — LIPID PANEL
Chol/HDL Ratio: 2.4 ratio (ref 0.0–4.4)
Cholesterol, Total: 129 mg/dL (ref 100–199)
HDL: 54 mg/dL (ref >39)
LDL Chol Calc (NIH): 63 mg/dL (ref 0–99)
Triglycerides: 54 mg/dL (ref 0–149)
VLDL Cholesterol Cal: 12 mg/dL (ref 5–40)

## 2024-01-29 LAB — HEMOGLOBIN A1C
Est. average glucose Bld gHb Est-mCnc: 120 mg/dL
Hgb A1c MFr Bld: 5.8 % — ABNORMAL HIGH (ref 4.8–5.6)

## 2024-01-29 LAB — TSH: TSH: 1.94 u[IU]/mL (ref 0.450–4.500)

## 2024-01-30 ENCOUNTER — Encounter: Payer: Self-pay | Admitting: Cardiology

## 2024-01-30 ENCOUNTER — Ambulatory Visit: Admitting: Cardiology

## 2024-01-30 VITALS — BP 106/68 | HR 94 | Ht 59.0 in | Wt 212.2 lb

## 2024-01-30 DIAGNOSIS — M25541 Pain in joints of right hand: Secondary | ICD-10-CM

## 2024-01-30 DIAGNOSIS — R7303 Prediabetes: Secondary | ICD-10-CM | POA: Diagnosis not present

## 2024-01-30 DIAGNOSIS — E66813 Obesity, class 3: Secondary | ICD-10-CM

## 2024-01-30 DIAGNOSIS — I129 Hypertensive chronic kidney disease with stage 1 through stage 4 chronic kidney disease, or unspecified chronic kidney disease: Secondary | ICD-10-CM | POA: Insufficient documentation

## 2024-01-30 DIAGNOSIS — Z6841 Body Mass Index (BMI) 40.0 and over, adult: Secondary | ICD-10-CM

## 2024-01-30 DIAGNOSIS — N184 Chronic kidney disease, stage 4 (severe): Secondary | ICD-10-CM

## 2024-01-30 DIAGNOSIS — I10 Essential (primary) hypertension: Secondary | ICD-10-CM

## 2024-01-30 DIAGNOSIS — E782 Mixed hyperlipidemia: Secondary | ICD-10-CM | POA: Diagnosis not present

## 2024-01-30 DIAGNOSIS — I509 Heart failure, unspecified: Secondary | ICD-10-CM | POA: Insufficient documentation

## 2024-01-30 DIAGNOSIS — R29898 Other symptoms and signs involving the musculoskeletal system: Secondary | ICD-10-CM

## 2024-01-30 DIAGNOSIS — M25542 Pain in joints of left hand: Secondary | ICD-10-CM

## 2024-01-30 DIAGNOSIS — R5383 Other fatigue: Secondary | ICD-10-CM

## 2024-01-30 MED ORDER — HYDROCORTISONE 1 % EX CREA: TOPICAL_CREAM | CUTANEOUS | 1 refills | Status: DC

## 2024-01-30 NOTE — Progress Notes (Unsigned)
 Established Patient Office Visit  Subjective:  Patient ID: Sarah Mack, female    DOB: February 27, 1956  Age: 68 y.o. MRN: [id number]  Chief Complaint  Patient presents with   Follow-up    3 month lab results    Patient in office for 3 month follow up, discuss recent lab results. Patient doing well. Patient complaining of a rash and itching. Will send in hydrocortisone  cream.  Patient requesting a referral to home health so she can continue physical and occupational therapy. Referral sent.  Discussed recent lab work. Hgb A1c improved. LDL at goal. Liver enzymes improved.  Kidney function improved. Follows with nephrology. Patient complains of bilateral hand pain. Unable to take ibuprofen due to kidney function. Recommend Tylenol , Voltaren gel.  Continue current medications.     No other concerns at this time.   Past Medical History:  Diagnosis Date   (HFpEF) heart failure with preserved ejection fraction (HCC)    a. 10/2015 Echo: EF 55-60%, no rwma, mild to mod AI, nl RV fxn.   Aortic insufficiency    a. 10/2015 Echo: Mild to mod AI.   GERD (gastroesophageal reflux disease)    History of stress test    a. 11/2016 Echo: Small region of mild fixed apical thinning. No ischemia/infarct. Low risk.   Hypertension    Leaky heart valve     Past Surgical History:  Procedure Laterality Date   COLONOSCOPY WITH PROPOFOL  N/A 08/06/2017   Procedure: COLONOSCOPY WITH PROPOFOL ;  Surgeon: Jinny Carmine, MD;  Location: [facility];  Service: Endoscopy;  Laterality: N/A;   COLONOSCOPY WITH PROPOFOL  N/A 03/09/2019   Procedure: COLONOSCOPY WITH PROPOFOL ;  Surgeon: Toledo, Ladell POUR, MD;  Location: [facility];  Service: Gastroenterology;  Laterality: N/A;   NO PAST SURGERIES      Social History   Socioeconomic History   Marital status: Widowed    Spouse name: Not on file   Number of children: Not on file   Years of education: Not on file   Highest education level: Not on file   Occupational History   Not on file  Tobacco Use   Smoking status: Every Day    Current packs/day: 0.50    Average packs/day: 0.5 packs/day for 10.0 years (5.0 ttl pk-yrs)    Types: Cigarettes   Smokeless tobacco: Never  Vaping Use   Vaping status: Never Used  Substance and Sexual Activity   Alcohol use: No   Drug use: No   Sexual activity: Not on file  Other Topics Concern   Not on file  Social History Narrative   Not on file   Social Drivers of Health   Financial Resource Strain: Not on file  Food Insecurity: Not on file  Transportation Needs: Not on file  Physical Activity: Not on file  Stress: Not on file  Social Connections: Not on file  Intimate Partner Violence: Not on file    Family History  Problem Relation Age of Onset   Liver cancer Mother    Heart failure Father    Diabetes Father    Hypertension Sister    Hypertension Brother    Leukemia Sister    Hypertension Brother    Alcoholism Brother    Hypertension Brother    Breast cancer Neg Hx     No Known Allergies  Outpatient Medications Prior to Visit  Medication Sig   albuterol  (VENTOLIN  HFA) 108 (90 Base) MCG/ACT inhaler Inhale 1 puff into the lungs as needed for wheezing or shortness  of breath.   Cholecalciferol (VITAMIN D3) 25 MCG (1000 UT) CAPS Take 1 capsule by mouth daily.   FARXIGA  10 MG TABS tablet TAKE 1 TABLET BY MOUTH DAILY BEFORE BREAKFAST.   metoprolol  succinate (TOPROL  XL) 25 MG 24 hr tablet Take 1 tablet (25 mg total) by mouth daily.   rosuvastatin  (CRESTOR ) 20 MG tablet TAKE 1 TABLET BY MOUTH EVERY DAY   spironolactone  (ALDACTONE ) 25 MG tablet TAKE 1 TABLET (25 MG TOTAL) BY MOUTH DAILY.   torsemide  (DEMADEX ) 20 MG tablet TAKE 1 TABLET BY MOUTH EVERY DAY   triamcinolone  ointment (KENALOG ) 0.5 % Apply 1 Application topically 2 (two) times daily.   ramipril  (ALTACE ) 10 MG capsule TAKE 1 CAPSULE BY MOUTH EVERY DAY IN THE MORNING (Patient not taking: Reported on 01/16/2024)   No  facility-administered medications prior to visit.    Review of Systems  Constitutional: Negative.   HENT: Negative.    Eyes: Negative.   Respiratory: Negative.  Negative for shortness of breath.   Cardiovascular: Negative.  Negative for chest pain.  Gastrointestinal: Negative.  Negative for abdominal pain, constipation and diarrhea.  Genitourinary: Negative.   Musculoskeletal:  Positive for joint pain. Negative for myalgias.  Skin:  Positive for itching and rash.  Neurological: Negative.  Negative for dizziness and headaches.  Endo/Heme/Allergies: Negative.   All other systems reviewed and are negative.      Objective:   BP 106/68   Pulse 94   Ht 4' 11 (1.499 m)   Wt 212 lb 3.2 oz (96.3 kg)   LMP  (LMP Unknown)   SpO2 (!) 88%   BMI 42.86 kg/m   Vitals:   01/30/24 1511  BP: 106/68  Pulse: 94  Height: 4' 11 (1.499 m)  Weight: 212 lb 3.2 oz (96.3 kg)  SpO2: (!) 88%  BMI (Calculated): 42.84    Physical Exam Vitals and nursing note reviewed.  Constitutional:      Appearance: Normal appearance. She is normal weight.  HENT:     Head: Normocephalic and atraumatic.     Nose: Nose normal.     Mouth/Throat:     Mouth: Mucous membranes are moist.  Eyes:     Extraocular Movements: Extraocular movements intact.     Conjunctiva/sclera: Conjunctivae normal.     Pupils: Pupils are equal, round, and reactive to light.  Cardiovascular:     Rate and Rhythm: Normal rate and regular rhythm.     Pulses: Normal pulses.     Heart sounds: Normal heart sounds.  Pulmonary:     Effort: Pulmonary effort is normal.     Breath sounds: Normal breath sounds.  Abdominal:     General: Abdomen is flat. Bowel sounds are normal.     Palpations: Abdomen is soft.  Musculoskeletal:        General: Normal range of motion.     Cervical back: Normal range of motion.  Skin:    General: Skin is warm and dry.  Neurological:     General: No focal deficit present.     Mental Status: She is  alert and oriented to person, place, and time.  Psychiatric:        Mood and Affect: Mood normal.        Behavior: Behavior normal.        Thought Content: Thought content normal.        Judgment: Judgment normal.      No results found for any visits on 01/30/24.  Recent Results (from  the past 2160 hours)  Hemoglobin A1c     Status: Abnormal   Collection Time: 01/28/24  9:14 AM  Result Value Ref Range   Hgb A1c MFr Bld 5.8 (H) 4.8 - 5.6 %    Comment:          Prediabetes: 5.7 - 6.4          Diabetes: >6.4          Glycemic control for adults with diabetes: <7.0    Est. average glucose Bld gHb Est-mCnc 120 mg/dL  TSH     Status: None   Collection Time: 01/28/24  9:14 AM  Result Value Ref Range   TSH 1.940 0.450 - 4.500 uIU/mL  CMP14+EGFR     Status: Abnormal   Collection Time: 01/28/24  9:14 AM  Result Value Ref Range   Glucose 95 70 - 99 mg/dL   BUN 27 8 - 27 mg/dL   Creatinine, Ser 8.14 (H) 0.57 - 1.00 mg/dL   eGFR 30 (L) >40 fO/fpw/8.26   BUN/Creatinine Ratio 15 12 - 28   Sodium 139 134 - 144 mmol/L   Potassium 4.9 3.5 - 5.2 mmol/L   Chloride 103 96 - 106 mmol/L   CO2 22 20 - 29 mmol/L   Calcium  9.8 8.7 - 10.3 mg/dL   Total Protein 7.5 6.0 - 8.5 g/dL   Albumin 4.0 3.9 - 4.9 g/dL   Globulin, Total 3.5 1.5 - 4.5 g/dL   Bilirubin Total 0.3 0.0 - 1.2 mg/dL   Alkaline Phosphatase 113 49 - 135 IU/L   AST 10 0 - 40 IU/L   ALT 5 0 - 32 IU/L  Lipid panel     Status: None   Collection Time: 01/28/24  9:14 AM  Result Value Ref Range   Cholesterol, Total 129 100 - 199 mg/dL   Triglycerides 54 0 - 149 mg/dL   HDL 54 >60 mg/dL   VLDL Cholesterol Cal 12 5 - 40 mg/dL   LDL Chol Calc (NIH) 63 0 - 99 mg/dL   Chol/HDL Ratio 2.4 0.0 - 4.4 ratio    Comment:                                   T. Chol/HDL Ratio                                             Men  Women                               1/2 Avg.Risk  3.4    3.3                                   Avg.Risk  5.0    4.4                                 2X Avg.Risk  9.6    7.1                                3X Avg.Risk 23.4  11.0       Assessment & Plan:  Referral sent for home health Continue current medications  Problem List Items Addressed This Visit       Cardiovascular and Mediastinum   Primary hypertension - Primary     Genitourinary   Stage 4 chronic kidney disease (HCC)     Other   Mixed hyperlipidemia   Fatigue   Relevant Orders   Ambulatory referral to Home Health   Prediabetes   Obesity   Arthralgia of both hands   Relevant Orders   Ambulatory referral to Orthopedic Surgery   Ambulatory referral to Home Health   Weakness of both lower extremities   Relevant Orders   Ambulatory referral to Home Health    Return in about 4 months (around 05/29/2024) for fasting labs prior.   Total time spent: 25 minutes. This time includes review of previous notes and results and patient face to face interaction during today's visit.    Jeoffrey Pollen, NP  01/30/2024   This document may have been prepared by Dragon Voice Recognition software and as such may include unintentional dictation errors.

## 2024-02-05 ENCOUNTER — Other Ambulatory Visit: Payer: Self-pay | Admitting: Cardiovascular Disease

## 2024-02-05 DIAGNOSIS — I5032 Chronic diastolic (congestive) heart failure: Secondary | ICD-10-CM

## 2024-02-08 ENCOUNTER — Other Ambulatory Visit: Payer: Self-pay | Admitting: Cardiology

## 2024-02-25 ENCOUNTER — Ambulatory Visit: Admitting: Cardiology

## 2024-02-25 ENCOUNTER — Ambulatory Visit: Payer: Self-pay | Admitting: Cardiology

## 2024-02-25 VITALS — BP 122/72 | HR 91 | Ht 59.0 in | Wt 209.6 lb

## 2024-02-25 DIAGNOSIS — I129 Hypertensive chronic kidney disease with stage 1 through stage 4 chronic kidney disease, or unspecified chronic kidney disease: Secondary | ICD-10-CM

## 2024-02-25 DIAGNOSIS — N3000 Acute cystitis without hematuria: Secondary | ICD-10-CM

## 2024-02-25 DIAGNOSIS — Z1389 Encounter for screening for other disorder: Secondary | ICD-10-CM | POA: Insufficient documentation

## 2024-02-25 DIAGNOSIS — R7303 Prediabetes: Secondary | ICD-10-CM

## 2024-02-25 LAB — POCT URINALYSIS DIPSTICK
Bilirubin, UA: NEGATIVE
Blood, UA: NEGATIVE
Glucose, UA: POSITIVE — AB
Ketones, UA: NEGATIVE
Leukocytes, UA: NEGATIVE
Nitrite, UA: NEGATIVE
Protein, UA: NEGATIVE
Spec Grav, UA: 1.01 (ref 1.010–1.025)
Urobilinogen, UA: 0.2 U/dL
pH, UA: 6 (ref 5.0–8.0)

## 2024-02-25 MED ORDER — NITROFURANTOIN MONOHYD MACRO 100 MG PO CAPS: 100.0000 mg | ORAL_CAPSULE | Freq: Two times a day (BID) | ORAL | 0 refills | Status: AC

## 2024-02-25 MED ORDER — TRIAMCINOLONE ACETONIDE 0.5 % EX OINT: 1.0000 | TOPICAL_OINTMENT | Freq: Two times a day (BID) | CUTANEOUS | 0 refills | Status: AC

## 2024-02-25 NOTE — Progress Notes (Unsigned)
 Established Patient Office Visit  Subjective:  Patient ID: Sarah Mack, female    DOB: 1956/03/15  Age: 68 y.o. MRN: [id number]  Chief Complaint  Patient presents with   Follow-up    Possible UTI, frequency & pressure    Patient in office for an acute visit, complaining of urinary frequency and bladder pressure. Symptoms started last Thursday. Denies chills, nausea, fever. UA today unremarkable. Will send for culture. Will treat empirically with Macrobid.  Blood pressure well controlled today.   Urinary Tract Infection  This is a new problem. The current episode started in the past 7 days. The problem occurs every urination. The problem has been unchanged. The pain is mild. There has been no fever. Associated symptoms include frequency and urgency. She has tried nothing for the symptoms. The treatment provided no relief.    No other concerns at this time.   Past Medical History:  Diagnosis Date   (HFpEF) heart failure with preserved ejection fraction (HCC)    a. 10/2015 Echo: EF 55-60%, no rwma, mild to mod AI, nl RV fxn.   Aortic insufficiency    a. 10/2015 Echo: Mild to mod AI.   GERD (gastroesophageal reflux disease)    History of stress test    a. 11/2016 Echo: Small region of mild fixed apical thinning. No ischemia/infarct. Low risk.   Hypertension    Leaky heart valve     Past Surgical History:  Procedure Laterality Date   COLONOSCOPY WITH PROPOFOL  N/A 08/06/2017   Procedure: COLONOSCOPY WITH PROPOFOL ;  Surgeon: Jinny Carmine, MD;  Location: [facility];  Service: Endoscopy;  Laterality: N/A;   COLONOSCOPY WITH PROPOFOL  N/A 03/09/2019   Procedure: COLONOSCOPY WITH PROPOFOL ;  Surgeon: Toledo, Ladell POUR, MD;  Location: [facility];  Service: Gastroenterology;  Laterality: N/A;   NO PAST SURGERIES      Social History   Socioeconomic History   Marital status: Widowed    Spouse name: Not on file   Number of children: Not on file   Years of education: Not on  file   Highest education level: Not on file  Occupational History   Not on file  Tobacco Use   Smoking status: Every Day    Current packs/day: 0.50    Average packs/day: 0.5 packs/day for 10.0 years (5.0 ttl pk-yrs)    Types: Cigarettes   Smokeless tobacco: Never  Vaping Use   Vaping status: Never Used  Substance and Sexual Activity   Alcohol use: No   Drug use: No   Sexual activity: Not on file  Other Topics Concern   Not on file  Social History Narrative   Not on file   Social Drivers of Health   Financial Resource Strain: Not on file  Food Insecurity: Not on file  Transportation Needs: Not on file  Physical Activity: Not on file  Stress: Not on file  Social Connections: Not on file  Intimate Partner Violence: Not on file    Family History  Problem Relation Age of Onset   Liver cancer Mother    Heart failure Father    Diabetes Father    Hypertension Sister    Hypertension Brother    Leukemia Sister    Hypertension Brother    Alcoholism Brother    Hypertension Brother    Breast cancer Neg Hx     No Known Allergies  Outpatient Medications Prior to Visit  Medication Sig   albuterol  (VENTOLIN  HFA) 108 (90 Base) MCG/ACT inhaler Inhale 1 puff  into the lungs as needed for wheezing or shortness of breath.   Cholecalciferol (VITAMIN D3) 25 MCG (1000 UT) CAPS Take 1 capsule by mouth daily.   FARXIGA  10 MG TABS tablet TAKE 1 TABLET BY MOUTH DAILY BEFORE BREAKFAST.   metoprolol  succinate (TOPROL  XL) 25 MG 24 hr tablet Take 1 tablet (25 mg total) by mouth daily.   ramipril  (ALTACE ) 10 MG capsule TAKE 1 CAPSULE BY MOUTH EVERY DAY IN THE MORNING (Patient not taking: Reported on 01/16/2024)   rosuvastatin  (CRESTOR ) 20 MG tablet TAKE 1 TABLET BY MOUTH EVERY DAY   spironolactone  (ALDACTONE ) 25 MG tablet TAKE 1 TABLET (25 MG TOTAL) BY MOUTH DAILY.   torsemide  (DEMADEX ) 20 MG tablet TAKE 1 TABLET BY MOUTH EVERY DAY   [DISCONTINUED] hydrocortisone  cream 1 % Apply to affected  area 2 times daily   [DISCONTINUED] triamcinolone  ointment (KENALOG ) 0.5 % Apply 1 Application topically 2 (two) times daily.   No facility-administered medications prior to visit.    Review of Systems  Constitutional: Negative.   HENT: Negative.    Eyes: Negative.   Respiratory: Negative.  Negative for shortness of breath.   Cardiovascular: Negative.  Negative for chest pain.  Gastrointestinal: Negative.  Negative for abdominal pain, constipation and diarrhea.  Genitourinary:  Positive for frequency and urgency.  Musculoskeletal:  Negative for joint pain and myalgias.  Skin: Negative.   Neurological: Negative.  Negative for dizziness and headaches.  Endo/Heme/Allergies: Negative.   All other systems reviewed and are negative.      Objective:   BP 122/72   Pulse 91   Ht 4' 11 (1.499 m)   Wt 209 lb 9.6 oz (95.1 kg)   LMP  (LMP Unknown)   SpO2 95%   BMI 42.33 kg/m   Vitals:   02/25/24 1514  BP: 122/72  Pulse: 91  Height: 4' 11 (1.499 m)  Weight: 209 lb 9.6 oz (95.1 kg)  SpO2: 95%  BMI (Calculated): 42.31    Physical Exam Vitals and nursing note reviewed.  Constitutional:      Appearance: Normal appearance. She is normal weight.  HENT:     Head: Normocephalic and atraumatic.     Nose: Nose normal.     Mouth/Throat:     Mouth: Mucous membranes are moist.  Eyes:     Extraocular Movements: Extraocular movements intact.     Conjunctiva/sclera: Conjunctivae normal.     Pupils: Pupils are equal, round, and reactive to light.  Cardiovascular:     Rate and Rhythm: Normal rate and regular rhythm.     Pulses: Normal pulses.     Heart sounds: Normal heart sounds.  Pulmonary:     Effort: Pulmonary effort is normal.     Breath sounds: Normal breath sounds.  Abdominal:     General: Abdomen is flat. Bowel sounds are normal.     Palpations: Abdomen is soft.  Musculoskeletal:        General: Normal range of motion.     Cervical back: Normal range of motion.   Skin:    General: Skin is warm and dry.  Neurological:     General: No focal deficit present.     Mental Status: She is alert and oriented to person, place, and time.  Psychiatric:        Mood and Affect: Mood normal.        Behavior: Behavior normal.        Thought Content: Thought content normal.  Judgment: Judgment normal.      Results for orders placed or performed in visit on 02/25/24  POCT Urinalysis Dipstick  Result Value Ref Range   Color, UA     Clarity, UA     Glucose, UA Positive (A) Negative   Bilirubin, UA Negative    Ketones, UA Negative    Spec Grav, UA 1.010 1.010 - 1.025   Blood, UA Negative    pH, UA 6.0 5.0 - 8.0   Protein, UA Negative Negative   Urobilinogen, UA 0.2 0.2 or 1.0 E.U./dL   Nitrite, UA Negative    Leukocytes, UA Negative Negative   Appearance     Odor      Recent Results (from the past 2160 hours)  Hemoglobin A1c     Status: Abnormal   Collection Time: 01/28/24  9:14 AM  Result Value Ref Range   Hgb A1c MFr Bld 5.8 (H) 4.8 - 5.6 %    Comment:          Prediabetes: 5.7 - 6.4          Diabetes: >6.4          Glycemic control for adults with diabetes: <7.0    Est. average glucose Bld gHb Est-mCnc 120 mg/dL  TSH     Status: None   Collection Time: 01/28/24  9:14 AM  Result Value Ref Range   TSH 1.940 0.450 - 4.500 uIU/mL  CMP14+EGFR     Status: Abnormal   Collection Time: 01/28/24  9:14 AM  Result Value Ref Range   Glucose 95 70 - 99 mg/dL   BUN 27 8 - 27 mg/dL   Creatinine, Ser 8.14 (H) 0.57 - 1.00 mg/dL   eGFR 30 (L) >40 fO/fpw/8.26   BUN/Creatinine Ratio 15 12 - 28   Sodium 139 134 - 144 mmol/L   Potassium 4.9 3.5 - 5.2 mmol/L   Chloride 103 96 - 106 mmol/L   CO2 22 20 - 29 mmol/L   Calcium  9.8 8.7 - 10.3 mg/dL   Total Protein 7.5 6.0 - 8.5 g/dL   Albumin 4.0 3.9 - 4.9 g/dL   Globulin, Total 3.5 1.5 - 4.5 g/dL   Bilirubin Total 0.3 0.0 - 1.2 mg/dL   Alkaline Phosphatase 113 49 - 135 IU/L   AST 10 0 - 40 IU/L    ALT 5 0 - 32 IU/L  Lipid panel     Status: None   Collection Time: 01/28/24  9:14 AM  Result Value Ref Range   Cholesterol, Total 129 100 - 199 mg/dL   Triglycerides 54 0 - 149 mg/dL   HDL 54 >60 mg/dL   VLDL Cholesterol Cal 12 5 - 40 mg/dL   LDL Chol Calc (NIH) 63 0 - 99 mg/dL   Chol/HDL Ratio 2.4 0.0 - 4.4 ratio    Comment:                                   T. Chol/HDL Ratio                                             Men  Women  1/2 Avg.Risk  3.4    3.3                                   Avg.Risk  5.0    4.4                                2X Avg.Risk  9.6    7.1                                3X Avg.Risk 23.4   11.0   POCT Urinalysis Dipstick     Status: Abnormal   Collection Time: 02/25/24  3:23 PM  Result Value Ref Range   Color, UA     Clarity, UA     Glucose, UA Positive (A) Negative    Comment: 250mg /dl   Bilirubin, UA Negative    Ketones, UA Negative    Spec Grav, UA 1.010 1.010 - 1.025   Blood, UA Negative    pH, UA 6.0 5.0 - 8.0   Protein, UA Negative Negative   Urobilinogen, UA 0.2 0.2 or 1.0 E.U./dL   Nitrite, UA Negative    Leukocytes, UA Negative Negative   Appearance     Odor        Assessment & Plan:  Urine culture Macrobid  Problem List Items Addressed This Visit       Genitourinary   Benign hypertensive kidney disease with chronic kidney disease   Acute cystitis without hematuria - Primary     Other   Screening for blood or protein in urine   Relevant Orders   POCT Urinalysis Dipstick (Completed)   Urine Culture    Return if symptoms worsen or fail to improve, for as scheduled.   Total time spent: 25 minutes. This time includes review of previous notes and results and patient face to face interaction during today's visit.    Jeoffrey Pollen, NP  02/25/2024   This document may have been prepared by [facility] Voice Recognition software and as such may include unintentional dictation errors.

## 2024-02-26 ENCOUNTER — Encounter: Payer: Self-pay | Admitting: Cardiology

## 2024-02-26 DIAGNOSIS — N3000 Acute cystitis without hematuria: Secondary | ICD-10-CM | POA: Insufficient documentation

## 2024-02-27 LAB — URINE CULTURE: Organism ID, Bacteria: NO GROWTH

## 2024-02-27 NOTE — Progress Notes (Signed)
Pt informed

## 2024-03-04 ENCOUNTER — Other Ambulatory Visit: Payer: Self-pay | Admitting: Cardiovascular Disease

## 2024-03-04 DIAGNOSIS — I5033 Acute on chronic diastolic (congestive) heart failure: Secondary | ICD-10-CM

## 2024-04-17 ENCOUNTER — Ambulatory Visit: Admitting: Cardiovascular Disease

## 2024-04-24 ENCOUNTER — Ambulatory Visit: Admitting: Cardiovascular Disease

## 2024-05-07 ENCOUNTER — Ambulatory Visit: Admitting: Cardiovascular Disease

## 2024-05-29 ENCOUNTER — Ambulatory Visit: Admitting: Cardiology
